# Patient Record
Sex: Male | Born: 1945 | Race: White | Hispanic: No | Marital: Married | Smoking: Never smoker
Health system: Southern US, Community
[De-identification: ages and names within clinical notes are randomized; demographics above are authoritative.]

## PROBLEM LIST (undated history)

## (undated) DIAGNOSIS — I5030 Unspecified diastolic (congestive) heart failure: Secondary | ICD-10-CM

## (undated) DIAGNOSIS — I10 Essential (primary) hypertension: Secondary | ICD-10-CM

## (undated) DIAGNOSIS — I495 Sick sinus syndrome: Secondary | ICD-10-CM

## (undated) DIAGNOSIS — T1490XA Injury, unspecified, initial encounter: Secondary | ICD-10-CM

## (undated) DIAGNOSIS — D649 Anemia, unspecified: Secondary | ICD-10-CM

## (undated) DIAGNOSIS — J45909 Unspecified asthma, uncomplicated: Secondary | ICD-10-CM

## (undated) DIAGNOSIS — E119 Type 2 diabetes mellitus without complications: Secondary | ICD-10-CM

## (undated) DIAGNOSIS — G4733 Obstructive sleep apnea (adult) (pediatric): Secondary | ICD-10-CM

## (undated) DIAGNOSIS — I2510 Atherosclerotic heart disease of native coronary artery without angina pectoris: Secondary | ICD-10-CM

---

## 2006-11-19 ENCOUNTER — Emergency Department (HOSPITAL_COMMUNITY): Admission: EM | Admit: 2006-11-19 | Discharge: 2006-11-19 | Payer: Self-pay | Admitting: Emergency Medicine

## 2007-12-16 IMAGING — CR DG HIP COMPLETE 2+V*R*
3 series · 3 of 3 positions shown · non-contrast
Comparison: none

CLINICAL DATA: Fall, hip pain

RIGHT HIP - 2  VIEW:

[t pelvis a.p.]
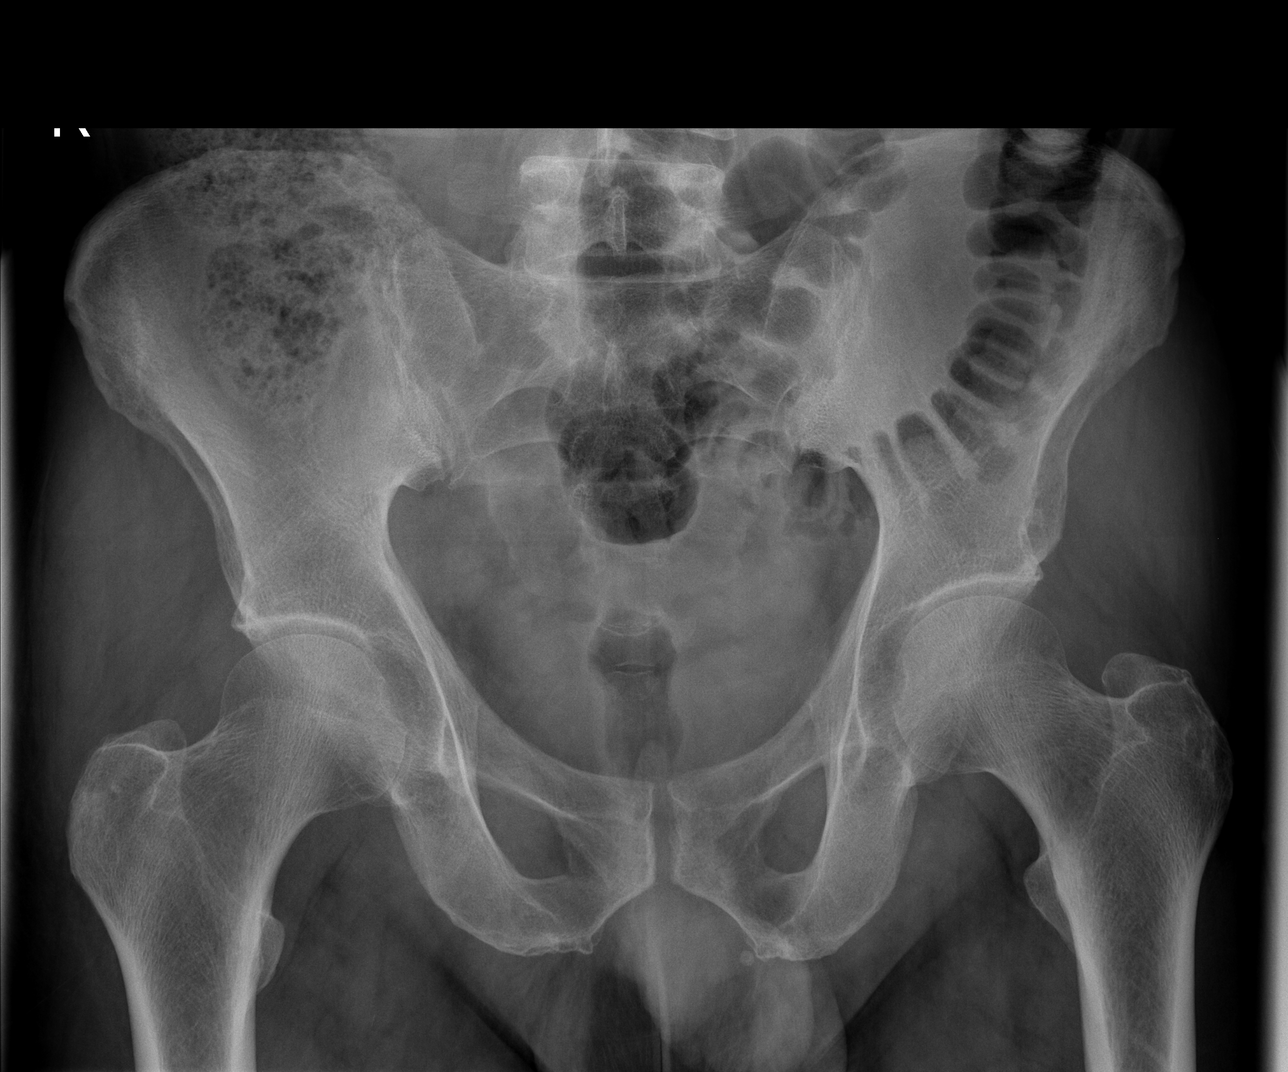

[t hip ap right]
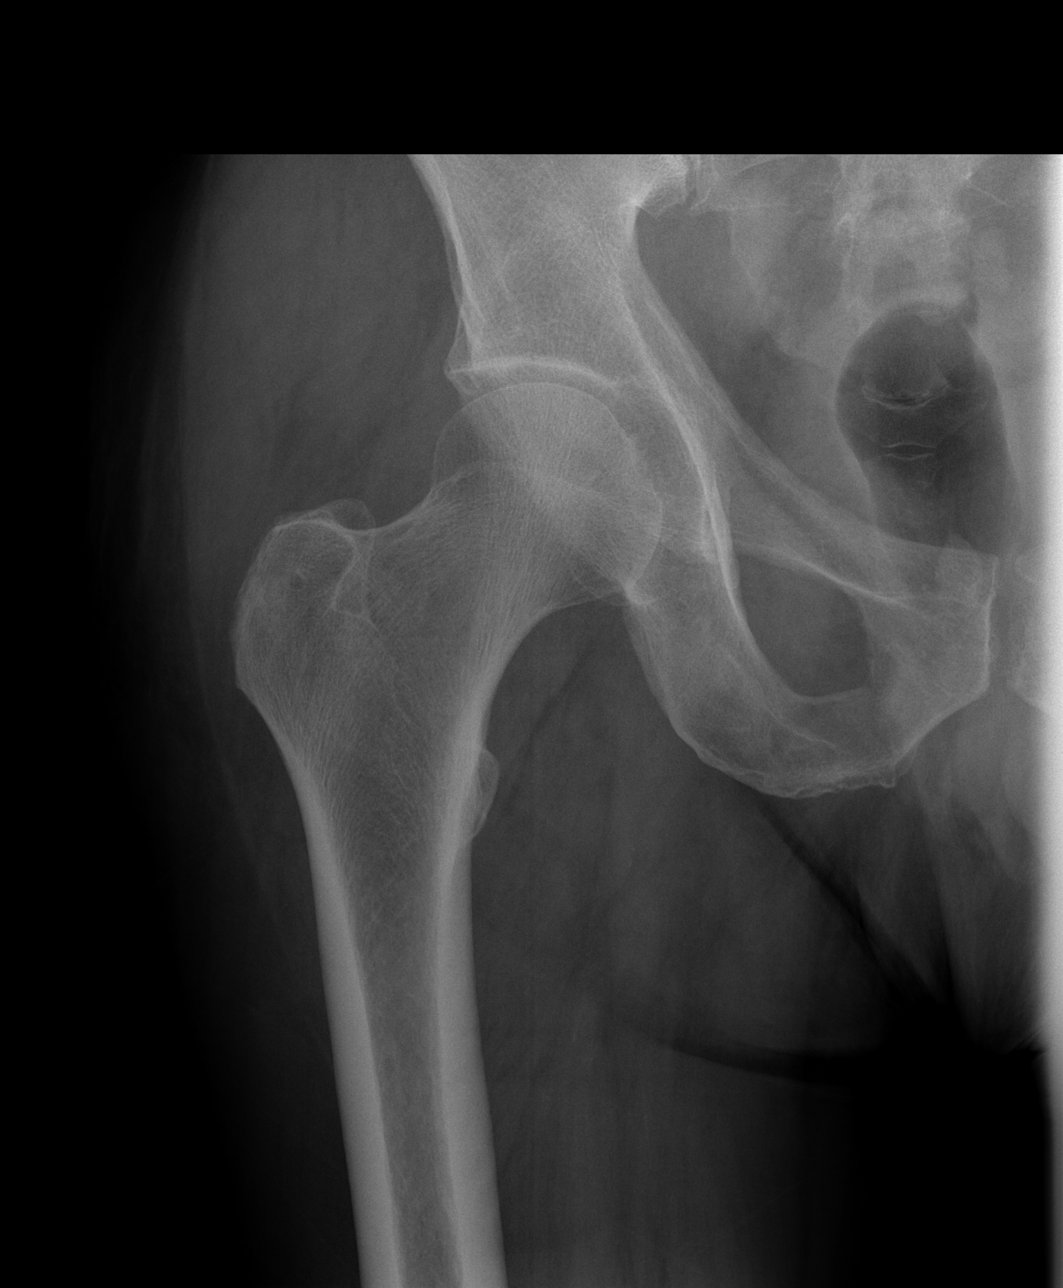

[t hip frog leg right]
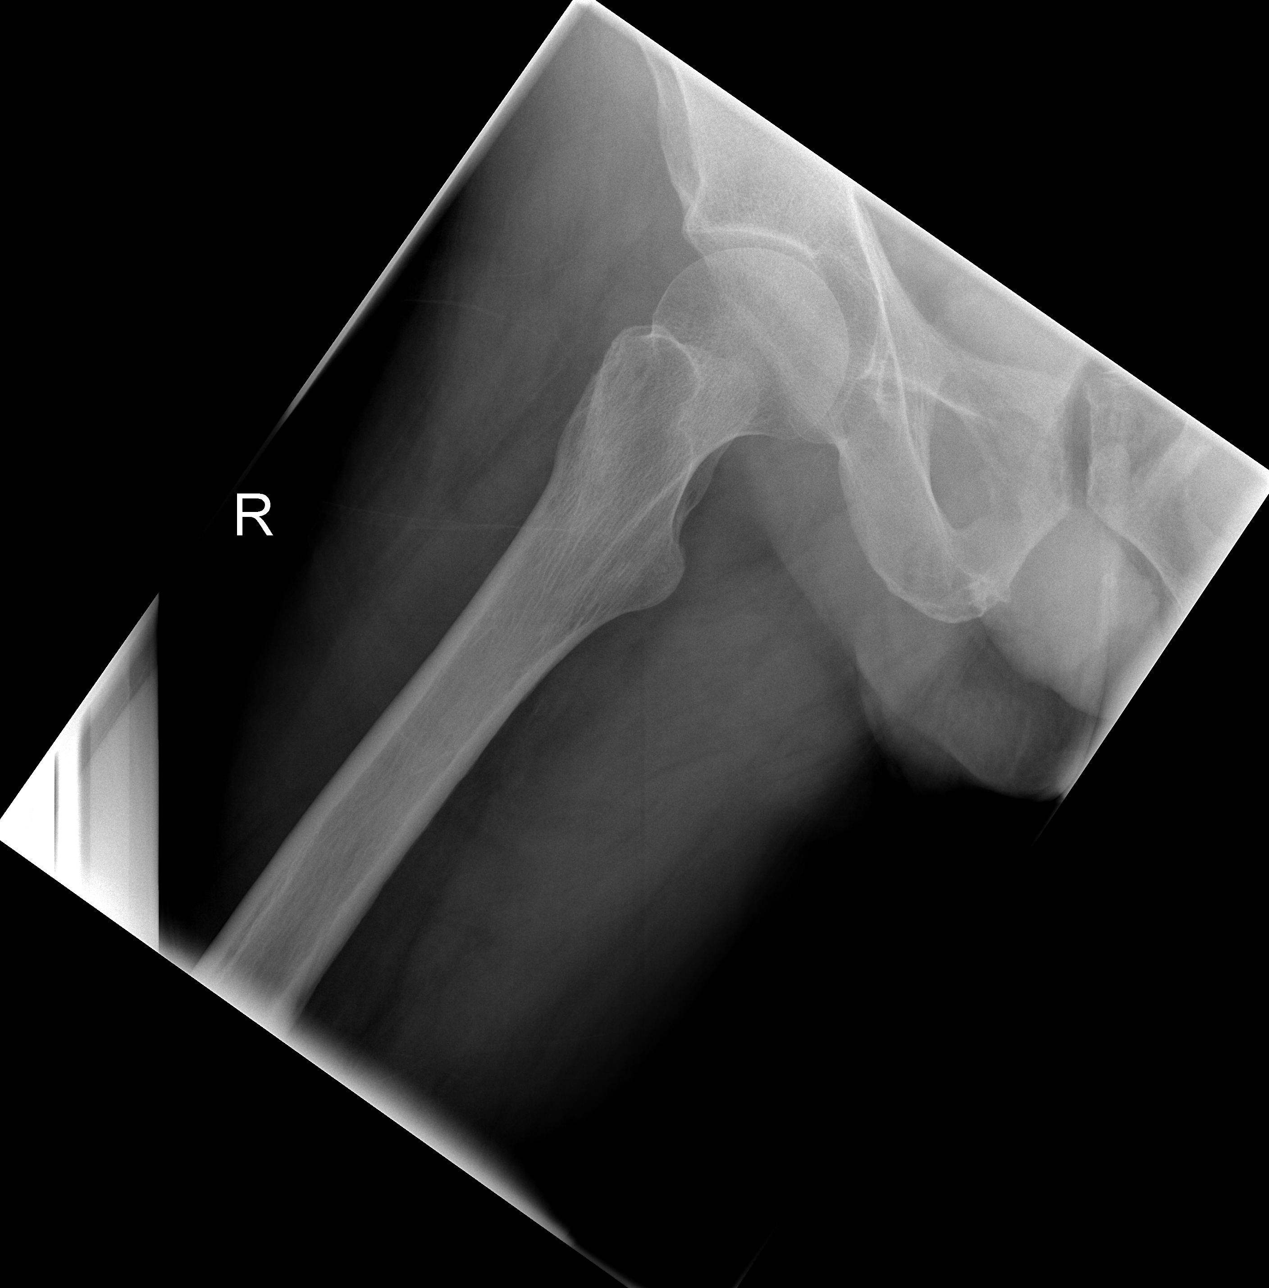

[3 of 3 positions shown; findings below may reference images not displayed]

FINDINGS: There is no evidence of hip fracture or dislocation.  There is no
evidence of arthropathy or other focal bone abnormality.
IMPRESSION: Negative.

## 2017-04-25 DIAGNOSIS — I10 Essential (primary) hypertension: Secondary | ICD-10-CM | POA: Diagnosis present

## 2017-04-25 DIAGNOSIS — E119 Type 2 diabetes mellitus without complications: Secondary | ICD-10-CM | POA: Insufficient documentation

## 2017-05-28 DIAGNOSIS — D500 Iron deficiency anemia secondary to blood loss (chronic): Secondary | ICD-10-CM | POA: Diagnosis present

## 2021-09-19 DIAGNOSIS — K222 Esophageal obstruction: Secondary | ICD-10-CM | POA: Insufficient documentation

## 2021-09-19 DIAGNOSIS — F0390 Unspecified dementia without behavioral disturbance: Secondary | ICD-10-CM | POA: Diagnosis present

## 2021-09-19 DIAGNOSIS — R001 Bradycardia, unspecified: Secondary | ICD-10-CM | POA: Insufficient documentation

## 2021-09-19 DIAGNOSIS — I495 Sick sinus syndrome: Secondary | ICD-10-CM | POA: Insufficient documentation

## 2021-09-19 DIAGNOSIS — G4733 Obstructive sleep apnea (adult) (pediatric): Secondary | ICD-10-CM | POA: Diagnosis present

## 2022-08-01 ENCOUNTER — Other Ambulatory Visit: Payer: Self-pay

## 2022-08-01 ENCOUNTER — Emergency Department (HOSPITAL_BASED_OUTPATIENT_CLINIC_OR_DEPARTMENT_OTHER): Payer: No Typology Code available for payment source

## 2022-08-01 ENCOUNTER — Inpatient Hospital Stay (HOSPITAL_BASED_OUTPATIENT_CLINIC_OR_DEPARTMENT_OTHER)
Admission: EM | Admit: 2022-08-01 | Discharge: 2022-08-03 | DRG: 177 | Disposition: A | Discharge status: Home Health | Payer: No Typology Code available for payment source | Attending: Internal Medicine | Admitting: Internal Medicine

## 2022-08-01 ENCOUNTER — Encounter (HOSPITAL_COMMUNITY): Payer: Self-pay | Admitting: Family Medicine

## 2022-08-01 DIAGNOSIS — R339 Retention of urine, unspecified: Secondary | ICD-10-CM | POA: Diagnosis present

## 2022-08-01 DIAGNOSIS — Z8673 Personal history of transient ischemic attack (TIA), and cerebral infarction without residual deficits: Secondary | ICD-10-CM

## 2022-08-01 DIAGNOSIS — K219 Gastro-esophageal reflux disease without esophagitis: Secondary | ICD-10-CM | POA: Diagnosis present

## 2022-08-01 DIAGNOSIS — Z79899 Other long term (current) drug therapy: Secondary | ICD-10-CM

## 2022-08-01 DIAGNOSIS — D500 Iron deficiency anemia secondary to blood loss (chronic): Secondary | ICD-10-CM | POA: Diagnosis not present

## 2022-08-01 DIAGNOSIS — I10 Essential (primary) hypertension: Secondary | ICD-10-CM | POA: Diagnosis present

## 2022-08-01 DIAGNOSIS — F0280 Dementia in other diseases classified elsewhere without behavioral disturbance: Secondary | ICD-10-CM | POA: Diagnosis present

## 2022-08-01 DIAGNOSIS — E1142 Type 2 diabetes mellitus with diabetic polyneuropathy: Secondary | ICD-10-CM | POA: Diagnosis present

## 2022-08-01 DIAGNOSIS — G309 Alzheimer's disease, unspecified: Secondary | ICD-10-CM | POA: Diagnosis present

## 2022-08-01 DIAGNOSIS — Z886 Allergy status to analgesic agent status: Secondary | ICD-10-CM

## 2022-08-01 DIAGNOSIS — J45909 Unspecified asthma, uncomplicated: Secondary | ICD-10-CM | POA: Diagnosis present

## 2022-08-01 DIAGNOSIS — G4733 Obstructive sleep apnea (adult) (pediatric): Secondary | ICD-10-CM | POA: Diagnosis present

## 2022-08-01 DIAGNOSIS — E1165 Type 2 diabetes mellitus with hyperglycemia: Secondary | ICD-10-CM | POA: Diagnosis present

## 2022-08-01 DIAGNOSIS — Z87820 Personal history of traumatic brain injury: Secondary | ICD-10-CM | POA: Diagnosis not present

## 2022-08-01 DIAGNOSIS — I5033 Acute on chronic diastolic (congestive) heart failure: Secondary | ICD-10-CM | POA: Diagnosis present

## 2022-08-01 DIAGNOSIS — I5030 Unspecified diastolic (congestive) heart failure: Secondary | ICD-10-CM | POA: Diagnosis present

## 2022-08-01 DIAGNOSIS — I2510 Atherosclerotic heart disease of native coronary artery without angina pectoris: Secondary | ICD-10-CM | POA: Diagnosis present

## 2022-08-01 DIAGNOSIS — R338 Other retention of urine: Secondary | ICD-10-CM | POA: Insufficient documentation

## 2022-08-01 DIAGNOSIS — I639 Cerebral infarction, unspecified: Secondary | ICD-10-CM | POA: Insufficient documentation

## 2022-08-01 DIAGNOSIS — R2689 Other abnormalities of gait and mobility: Secondary | ICD-10-CM | POA: Insufficient documentation

## 2022-08-01 DIAGNOSIS — E785 Hyperlipidemia, unspecified: Secondary | ICD-10-CM | POA: Diagnosis present

## 2022-08-01 DIAGNOSIS — J9601 Acute respiratory failure with hypoxia: Secondary | ICD-10-CM | POA: Diagnosis present

## 2022-08-01 DIAGNOSIS — Z950 Presence of cardiac pacemaker: Secondary | ICD-10-CM | POA: Insufficient documentation

## 2022-08-01 DIAGNOSIS — I110 Hypertensive heart disease with heart failure: Secondary | ICD-10-CM | POA: Diagnosis present

## 2022-08-01 DIAGNOSIS — I4821 Permanent atrial fibrillation: Secondary | ICD-10-CM | POA: Diagnosis not present

## 2022-08-01 DIAGNOSIS — Z7902 Long term (current) use of antithrombotics/antiplatelets: Secondary | ICD-10-CM | POA: Diagnosis not present

## 2022-08-01 DIAGNOSIS — F0390 Unspecified dementia without behavioral disturbance: Secondary | ICD-10-CM | POA: Diagnosis present

## 2022-08-01 DIAGNOSIS — Z888 Allergy status to other drugs, medicaments and biological substances status: Secondary | ICD-10-CM

## 2022-08-01 DIAGNOSIS — U071 COVID-19: Principal | ICD-10-CM | POA: Insufficient documentation

## 2022-08-01 DIAGNOSIS — I495 Sick sinus syndrome: Secondary | ICD-10-CM | POA: Diagnosis present

## 2022-08-01 DIAGNOSIS — K200 Eosinophilic esophagitis: Secondary | ICD-10-CM | POA: Insufficient documentation

## 2022-08-01 DIAGNOSIS — R509 Fever, unspecified: Secondary | ICD-10-CM | POA: Diagnosis present

## 2022-08-01 DIAGNOSIS — G252 Other specified forms of tremor: Secondary | ICD-10-CM | POA: Insufficient documentation

## 2022-08-01 DIAGNOSIS — Z7984 Long term (current) use of oral hypoglycemic drugs: Secondary | ICD-10-CM

## 2022-08-01 DIAGNOSIS — Z794 Long term (current) use of insulin: Secondary | ICD-10-CM

## 2022-08-01 DIAGNOSIS — R1314 Dysphagia, pharyngoesophageal phase: Secondary | ICD-10-CM | POA: Insufficient documentation

## 2022-08-01 DIAGNOSIS — Z882 Allergy status to sulfonamides status: Secondary | ICD-10-CM

## 2022-08-01 DIAGNOSIS — Z7901 Long term (current) use of anticoagulants: Secondary | ICD-10-CM

## 2022-08-01 HISTORY — DX: Type 2 diabetes mellitus without complications: E11.9

## 2022-08-01 HISTORY — DX: Atherosclerotic heart disease of native coronary artery without angina pectoris: I25.10

## 2022-08-01 HISTORY — DX: Injury, unspecified, initial encounter: T14.90XA

## 2022-08-01 HISTORY — DX: Unspecified asthma, uncomplicated: J45.909

## 2022-08-01 HISTORY — DX: Essential (primary) hypertension: I10

## 2022-08-01 HISTORY — DX: Anemia, unspecified: D64.9

## 2022-08-01 HISTORY — DX: Unspecified diastolic (congestive) heart failure: I50.30

## 2022-08-01 HISTORY — DX: Obstructive sleep apnea (adult) (pediatric): G47.33

## 2022-08-01 HISTORY — DX: Sick sinus syndrome: I49.5

## 2022-08-01 LAB — URINALYSIS, ROUTINE W REFLEX MICROSCOPIC
Bilirubin Urine: NEGATIVE
Glucose, UA: 100 mg/dL — AB
Hgb urine dipstick: NEGATIVE
Ketones, ur: NEGATIVE mg/dL
Leukocytes,Ua: NEGATIVE
Nitrite: NEGATIVE
Protein, ur: 100 mg/dL — AB
Specific Gravity, Urine: 1.025 (ref 1.005–1.030)
pH: 5.5 (ref 5.0–8.0)

## 2022-08-01 LAB — COMPREHENSIVE METABOLIC PANEL WITH GFR
ALT: 11 U/L (ref 0–44)
AST: 19 U/L (ref 15–41)
Albumin: 2.8 g/dL — ABNORMAL LOW (ref 3.5–5.0)
Alkaline Phosphatase: 70 U/L (ref 38–126)
Anion gap: 7 (ref 5–15)
BUN: 14 mg/dL (ref 8–23)
CO2: 23 mmol/L (ref 22–32)
Calcium: 8.1 mg/dL — ABNORMAL LOW (ref 8.9–10.3)
Chloride: 104 mmol/L (ref 98–111)
Creatinine, Ser: 1.27 mg/dL — ABNORMAL HIGH (ref 0.61–1.24)
GFR, Estimated: 59 mL/min — ABNORMAL LOW
Glucose, Bld: 216 mg/dL — ABNORMAL HIGH (ref 70–99)
Potassium: 4.2 mmol/L (ref 3.5–5.1)
Sodium: 134 mmol/L — ABNORMAL LOW (ref 135–145)
Total Bilirubin: 1.1 mg/dL (ref 0.3–1.2)
Total Protein: 6.5 g/dL (ref 6.5–8.1)

## 2022-08-01 LAB — URINALYSIS, MICROSCOPIC (REFLEX)

## 2022-08-01 LAB — CBC WITH DIFFERENTIAL/PLATELET
Abs Immature Granulocytes: 0.05 10*3/uL (ref 0.00–0.07)
Basophils Absolute: 0 10*3/uL (ref 0.0–0.1)
Basophils Relative: 1 %
Eosinophils Absolute: 0.1 10*3/uL (ref 0.0–0.5)
Eosinophils Relative: 1 %
HCT: 26.6 % — ABNORMAL LOW (ref 39.0–52.0)
Hemoglobin: 8.7 g/dL — ABNORMAL LOW (ref 13.0–17.0)
Immature Granulocytes: 1 %
Lymphocytes Relative: 9 %
Lymphs Abs: 0.8 10*3/uL (ref 0.7–4.0)
MCH: 30.4 pg (ref 26.0–34.0)
MCHC: 32.7 g/dL (ref 30.0–36.0)
MCV: 93 fL (ref 80.0–100.0)
Monocytes Absolute: 0.5 10*3/uL (ref 0.1–1.0)
Monocytes Relative: 7 %
Neutro Abs: 6.6 10*3/uL (ref 1.7–7.7)
Neutrophils Relative %: 81 %
Platelets: 163 10*3/uL (ref 150–400)
RBC: 2.86 MIL/uL — ABNORMAL LOW (ref 4.22–5.81)
RDW: 16 % — ABNORMAL HIGH (ref 11.5–15.5)
WBC: 8.1 10*3/uL (ref 4.0–10.5)
nRBC: 0 % (ref 0.0–0.2)

## 2022-08-01 LAB — TROPONIN I (HIGH SENSITIVITY)
Troponin I (High Sensitivity): 30 ng/L — ABNORMAL HIGH (ref <18)
Troponin I (High Sensitivity): 62 ng/L — ABNORMAL HIGH

## 2022-08-01 LAB — GLUCOSE, CAPILLARY: Glucose-Capillary: 316 mg/dL — ABNORMAL HIGH (ref 70–99)

## 2022-08-01 LAB — RESP PANEL BY RT-PCR (RSV, FLU A&B, COVID)  RVPGX2
Influenza A by PCR: NEGATIVE
Influenza B by PCR: NEGATIVE
Resp Syncytial Virus by PCR: NEGATIVE
SARS Coronavirus 2 by RT PCR: POSITIVE — AB

## 2022-08-01 LAB — APTT: aPTT: 50 s — ABNORMAL HIGH (ref 24–36)

## 2022-08-01 LAB — PROTIME-INR
INR: 1.3 — ABNORMAL HIGH (ref 0.8–1.2)
Prothrombin Time: 16.5 s — ABNORMAL HIGH (ref 11.4–15.2)

## 2022-08-01 LAB — LACTIC ACID, PLASMA
Lactic Acid, Venous: 1.9 mmol/L (ref 0.5–1.9)
Lactic Acid, Venous: 1 mmol/L (ref 0.5–1.9)

## 2022-08-01 LAB — TSH: TSH: 1.687 u[IU]/mL (ref 0.350–4.500)

## 2022-08-01 LAB — BRAIN NATRIURETIC PEPTIDE: B Natriuretic Peptide: 556.5 pg/mL — ABNORMAL HIGH (ref 0.0–100.0)

## 2022-08-01 MED ORDER — SODIUM CHLORIDE 0.9 % IV SOLN: 250.0000 mL | INTRAVENOUS | Status: DC | PRN

## 2022-08-01 MED ORDER — SODIUM CHLORIDE 0.9 % IV SOLN
100.0000 mg | Freq: Every day | INTRAVENOUS | Status: AC
  Administered 2022-08-02 – 2022-08-03 (×2): 100 mg via INTRAVENOUS
  Filled 2022-08-01 (×2): qty 20

## 2022-08-01 MED ORDER — SODIUM CHLORIDE 0.9 % IV SOLN
500.0000 mg | INTRAVENOUS | Status: DC
  Administered 2022-08-01 – 2022-08-02 (×2): 500 mg via INTRAVENOUS
  Filled 2022-08-01 (×2): qty 5

## 2022-08-01 MED ORDER — INSULIN ASPART PROT & ASPART (70-30 MIX) 100 UNIT/ML ~~LOC~~ SUSP
30.0000 [IU] | Freq: Every day | SUBCUTANEOUS | Status: DC
  Administered 2022-08-02: 30 [IU] via SUBCUTANEOUS

## 2022-08-01 MED ORDER — SODIUM CHLORIDE 0.9 % IV SOLN
2.0000 g | INTRAVENOUS | Status: DC
  Administered 2022-08-01 – 2022-08-02 (×2): 2 g via INTRAVENOUS
  Filled 2022-08-01 (×2): qty 20

## 2022-08-01 MED ORDER — INSULIN ASPART PROT & ASPART (70-30 MIX) 100 UNIT/ML ~~LOC~~ SUSP
20.0000 [IU] | Freq: Every day | SUBCUTANEOUS | Status: DC
  Filled 2022-08-01: qty 10

## 2022-08-01 MED ORDER — ONDANSETRON HCL 4 MG/2ML IJ SOLN: 4.0000 mg | Freq: Four times a day (QID) | INTRAMUSCULAR | Status: DC | PRN

## 2022-08-01 MED ORDER — SODIUM CHLORIDE 0.9 % IV BOLUS (SEPSIS): 1000.0000 mL | Freq: Once | INTRAVENOUS | Status: DC

## 2022-08-01 MED ORDER — ALOGLIPTIN BENZOATE 25 MG PO TABS: 25.0000 mg | ORAL_TABLET | Freq: Every morning | ORAL | Status: DC

## 2022-08-01 MED ORDER — INSULIN ASPART 100 UNIT/ML IJ SOLN
0.0000 [IU] | Freq: Three times a day (TID) | INTRAMUSCULAR | Status: DC
  Administered 2022-08-02: 11 [IU] via SUBCUTANEOUS

## 2022-08-01 MED ORDER — METOPROLOL TARTRATE 5 MG/5ML IV SOLN: 5.0000 mg | Freq: Four times a day (QID) | INTRAVENOUS | Status: DC | PRN

## 2022-08-01 MED ORDER — METFORMIN HCL 500 MG PO TABS
1000.0000 mg | ORAL_TABLET | Freq: Two times a day (BID) | ORAL | Status: DC
  Administered 2022-08-02 – 2022-08-03 (×3): 1000 mg via ORAL
  Filled 2022-08-01 (×3): qty 2

## 2022-08-01 MED ORDER — INSULIN ASPART PROT & ASPART (70-30 MIX) 100 UNIT/ML PEN: 20.0000 [IU] | PEN_INJECTOR | Freq: Two times a day (BID) | SUBCUTANEOUS | Status: DC

## 2022-08-01 MED ORDER — ONDANSETRON HCL 4 MG PO TABS: 4.0000 mg | ORAL_TABLET | Freq: Four times a day (QID) | ORAL | Status: DC | PRN

## 2022-08-01 MED ORDER — LISINOPRIL 20 MG PO TABS
20.0000 mg | ORAL_TABLET | Freq: Every day | ORAL | Status: DC
  Administered 2022-08-02 – 2022-08-03 (×2): 20 mg via ORAL
  Filled 2022-08-01 (×2): qty 1

## 2022-08-01 MED ORDER — FUROSEMIDE 10 MG/ML IJ SOLN
40.0000 mg | Freq: Once | INTRAMUSCULAR | Status: AC
  Administered 2022-08-01: 40 mg via INTRAVENOUS
  Filled 2022-08-01: qty 4

## 2022-08-01 MED ORDER — SODIUM CHLORIDE 0.9% FLUSH: 3.0000 mL | INTRAVENOUS | Status: DC | PRN

## 2022-08-01 MED ORDER — TERAZOSIN HCL 5 MG PO CAPS
5.0000 mg | ORAL_CAPSULE | Freq: Every day | ORAL | Status: DC
  Administered 2022-08-02 – 2022-08-03 (×2): 5 mg via ORAL
  Filled 2022-08-01 (×3): qty 1

## 2022-08-01 MED ORDER — ROSUVASTATIN CALCIUM 20 MG PO TABS
20.0000 mg | ORAL_TABLET | Freq: Every day | ORAL | Status: DC
  Administered 2022-08-02 – 2022-08-03 (×2): 20 mg via ORAL
  Filled 2022-08-01 (×2): qty 1

## 2022-08-01 MED ORDER — HYDRALAZINE HCL 50 MG PO TABS
100.0000 mg | ORAL_TABLET | Freq: Three times a day (TID) | ORAL | Status: DC
  Administered 2022-08-02 – 2022-08-03 (×5): 100 mg via ORAL
  Filled 2022-08-01 (×5): qty 2

## 2022-08-01 MED ORDER — SODIUM CHLORIDE 0.9 % IV SOLN: 100.0000 mg | Freq: Every day | INTRAVENOUS | Status: DC

## 2022-08-01 MED ORDER — VITAMIN D (ERGOCALCIFEROL) 1.25 MG (50000 UNIT) PO CAPS: 50000.0000 [IU] | ORAL_CAPSULE | ORAL | Status: DC

## 2022-08-01 MED ORDER — POLYETHYLENE GLYCOL 3350 17 G PO PACK: 17.0000 g | PACK | Freq: Every day | ORAL | Status: DC | PRN

## 2022-08-01 MED ORDER — BUDESONIDE 0.5 MG/2ML IN SUSP
0.5000 mg | Freq: Two times a day (BID) | RESPIRATORY_TRACT | Status: DC
  Administered 2022-08-01 – 2022-08-03 (×4): 0.5 mg via RESPIRATORY_TRACT
  Filled 2022-08-01 (×4): qty 2

## 2022-08-01 MED ORDER — VITAMIN B-12 1000 MCG PO TABS
1000.0000 ug | ORAL_TABLET | Freq: Every day | ORAL | Status: DC
  Administered 2022-08-02 – 2022-08-03 (×2): 1000 ug via ORAL
  Filled 2022-08-01 (×2): qty 1

## 2022-08-01 MED ORDER — METHYLPREDNISOLONE SODIUM SUCC 125 MG IJ SOLR
1.0000 mg/kg | Freq: Two times a day (BID) | INTRAMUSCULAR | Status: DC
  Administered 2022-08-01 – 2022-08-03 (×5): 84.375 mg via INTRAVENOUS
  Filled 2022-08-01 (×5): qty 2

## 2022-08-01 MED ORDER — MEMANTINE HCL 10 MG PO TABS
10.0000 mg | ORAL_TABLET | Freq: Every day | ORAL | Status: DC
  Administered 2022-08-02 – 2022-08-03 (×2): 10 mg via ORAL
  Filled 2022-08-01 (×2): qty 1

## 2022-08-01 MED ORDER — METOPROLOL TARTRATE 25 MG PO TABS
25.0000 mg | ORAL_TABLET | Freq: Every morning | ORAL | Status: DC
  Administered 2022-08-03: 25 mg via ORAL
  Filled 2022-08-01 (×2): qty 1

## 2022-08-01 MED ORDER — GALANTAMINE HYDROBROMIDE ER 8 MG PO CP24
24.0000 mg | ORAL_CAPSULE | Freq: Every day | ORAL | Status: DC
  Administered 2022-08-02 – 2022-08-03 (×2): 24 mg via ORAL
  Filled 2022-08-01 (×3): qty 3

## 2022-08-01 MED ORDER — ACETAMINOPHEN 500 MG PO TABS
1000.0000 mg | ORAL_TABLET | ORAL | Status: DC | PRN
  Administered 2022-08-02: 1000 mg via ORAL
  Filled 2022-08-01 (×2): qty 2

## 2022-08-01 MED ORDER — LINAGLIPTIN 5 MG PO TABS
5.0000 mg | ORAL_TABLET | Freq: Every day | ORAL | Status: DC
  Administered 2022-08-02 – 2022-08-03 (×2): 5 mg via ORAL
  Filled 2022-08-01 (×2): qty 1

## 2022-08-01 MED ORDER — OXCARBAZEPINE 300 MG PO TABS
300.0000 mg | ORAL_TABLET | Freq: Every day | ORAL | Status: DC
  Administered 2022-08-01 – 2022-08-03 (×3): 300 mg via ORAL
  Filled 2022-08-01 (×3): qty 1

## 2022-08-01 MED ORDER — FUROSEMIDE 10 MG/ML IJ SOLN
40.0000 mg | Freq: Every day | INTRAMUSCULAR | Status: DC
  Administered 2022-08-02 – 2022-08-03 (×2): 40 mg via INTRAVENOUS
  Filled 2022-08-01 (×2): qty 4

## 2022-08-01 MED ORDER — SODIUM CHLORIDE 0.9 % IV SOLN
100.0000 mg | Freq: Once | INTRAVENOUS | Status: AC
  Administered 2022-08-01: 100 mg via INTRAVENOUS

## 2022-08-01 MED ORDER — FERROUS GLUCONATE 324 (38 FE) MG PO TABS
324.0000 mg | ORAL_TABLET | ORAL | Status: DC
  Administered 2022-08-03: 324 mg via ORAL
  Filled 2022-08-01: qty 1

## 2022-08-01 MED ORDER — PREDNISONE 50 MG PO TABS: 50.0000 mg | ORAL_TABLET | Freq: Every day | ORAL | Status: DC

## 2022-08-01 MED ORDER — ACETAMINOPHEN 325 MG PO TABS
650.0000 mg | ORAL_TABLET | Freq: Once | ORAL | Status: AC
  Administered 2022-08-01: 650 mg via ORAL
  Filled 2022-08-01: qty 2

## 2022-08-01 MED ORDER — PANTOPRAZOLE SODIUM 40 MG PO TBEC
40.0000 mg | DELAYED_RELEASE_TABLET | Freq: Every day | ORAL | Status: DC
  Administered 2022-08-02 – 2022-08-03 (×2): 40 mg via ORAL
  Filled 2022-08-01 (×2): qty 1

## 2022-08-01 MED ORDER — SODIUM CHLORIDE 0.9 % IV SOLN: 200.0000 mg | Freq: Once | INTRAVENOUS | Status: DC

## 2022-08-01 MED ORDER — INSULIN ASPART 100 UNIT/ML IJ SOLN
5.0000 [IU] | Freq: Once | INTRAMUSCULAR | Status: AC
  Administered 2022-08-01: 5 [IU] via SUBCUTANEOUS

## 2022-08-01 MED ORDER — SODIUM CHLORIDE 0.9% FLUSH
3.0000 mL | Freq: Two times a day (BID) | INTRAVENOUS | Status: DC
  Administered 2022-08-01 – 2022-08-03 (×4): 3 mL via INTRAVENOUS

## 2022-08-01 MED ORDER — TAMSULOSIN HCL 0.4 MG PO CAPS
0.4000 mg | ORAL_CAPSULE | Freq: Every day | ORAL | Status: DC
  Administered 2022-08-02 – 2022-08-03 (×2): 0.4 mg via ORAL
  Filled 2022-08-01 (×2): qty 1

## 2022-08-01 MED ORDER — GABAPENTIN 400 MG PO CAPS
800.0000 mg | ORAL_CAPSULE | Freq: Three times a day (TID) | ORAL | Status: DC
  Administered 2022-08-01 – 2022-08-03 (×5): 800 mg via ORAL
  Filled 2022-08-01 (×5): qty 2

## 2022-08-01 MED ORDER — LACTATED RINGERS IV SOLN: INTRAVENOUS | Status: DC

## 2022-08-01 MED ORDER — TRAMADOL HCL 50 MG PO TABS: 50.0000 mg | ORAL_TABLET | Freq: Three times a day (TID) | ORAL | Status: DC | PRN

## 2022-08-01 MED ORDER — DABIGATRAN ETEXILATE MESYLATE 150 MG PO CAPS
150.0000 mg | ORAL_CAPSULE | Freq: Two times a day (BID) | ORAL | Status: DC
  Administered 2022-08-01 – 2022-08-03 (×4): 150 mg via ORAL
  Filled 2022-08-01 (×6): qty 1

## 2022-08-01 NOTE — Assessment & Plan Note (Signed)
Continue Zestril Continue Lopressor Continue Hytrin

## 2022-08-01 NOTE — Assessment & Plan Note (Signed)
Continue PPI ?

## 2022-08-01 NOTE — Progress Notes (Signed)
Elink following code sepsis °

## 2022-08-01 NOTE — ED Triage Notes (Signed)
Reports + covid test today; been feeling unwell since yesterday with cough, congestion, fever and some trouble breathing.

## 2022-08-01 NOTE — ED Notes (Signed)
Called [facility] for Transport talked to [facility] at 4:12

## 2022-08-01 NOTE — Assessment & Plan Note (Signed)
Continue statin. 

## 2022-08-01 NOTE — Assessment & Plan Note (Signed)
Likely secondary to COVID O2 as needed Remdesivir Steroids Supportive care Could be a component of fluid overload as he has an elevated BNP and lower extremity swelling.  Will continue Lasix.

## 2022-08-01 NOTE — Assessment & Plan Note (Addendum)
Per problem list Echo - most recent 05/2022 with Normal EF per Care everywhere Trend troponins - stress test in 8/23 showed small area of reversible disease for medical management only EKG in the morning Continue telemetry Continue Lasix, beta-blockers

## 2022-08-01 NOTE — Hospital Course (Signed)
Patient is a 77 year old with past medical history of T2DM, HTN, HLD, HFpEF, sick sinus syndrome and chronic A-fib fib on anticoagulation with permanent pacemaker, history of TBI with dementia who is followed for most of his care at the [facility].  Recently had a TIA in January of this year.  He was in his usual state of health until last evening when he developed a hacking cough, nasal congestion, and fever.  This morning he got more short of breath and noted new onset lower extremity swelling.  He was noted to be COVID-positive at home and in the ED.  He had a negative chest x-ray but new oxygen requirement.  He was started on remdesivir and steroids.  He was also given a dose of IV antibiotics.  He had a normal lactic acid but elevated BNP.  He was given 1 dose of IV Lasix.

## 2022-08-01 NOTE — Assessment & Plan Note (Signed)
Per chart review, unable to reach wife to confirm if he is taking on CPAP or not

## 2022-08-01 NOTE — Assessment & Plan Note (Signed)
On beta-blockers Currently rate controlled On Pradaxa

## 2022-08-01 NOTE — Assessment & Plan Note (Addendum)
Likely secondary to COVID Oxygen as needed Remdesivir Steroids Supportive care Could be component of fluid overload as patient has swelling in his lower extremities as well as an elevated BNP. Continue Lasix On ceftriaxone and azithromycin in case there is some superimposed pneumonia-will check procalcitonin and limit antibiotics if needed.

## 2022-08-01 NOTE — H&P (Addendum)
History and Physical    Patient: Aaron Pope [id number] DOB: [birth year] DOA: 08/01/2022 DOS: the patient was seen and examined on 08/01/2022 PCP: [facility]  Patient coming from: Home  Chief Complaint:  Chief Complaint  Patient presents with   Fever   HPI: Aaron Pope is a 77 y.o. male with medical history significant of T2DM, HTN, HLD, HFpEF, sick sinus syndrome and chronic A-fib on anticoagulation with permanent pacemaker, history of TBI with dementia, OSA who is followed for most of his care at the [facility].  Recently had a TIA in January of this year.  He was in his usual state of health until last evening when he developed a hacking cough, nasal congestion, and fever.  This morning he got more short of breath and noted new onset lower extremity swelling.  He was noted to be COVID-positive at home and in the ED.  He had a negative chest x-ray but new oxygen requirement.  He was started on remdesivir and steroids.  He was also given a dose of IV antibiotics.  He had a normal lactic acid but elevated BNP.  He was given 1 dose of IV Lasix.  Review of Systems: As mentioned in the history of present illness. All other systems reviewed and are negative.  Past Medical History:  Diagnosis Date   Anemia    Asthma    Coronary artery disease    Diabetes mellitus without complication (HCC)    Diastolic heart failure (HCC)    Hypertension    OSA (obstructive sleep apnea)    Sick sinus syndrome ([place])    Trauma    History reviewed. No pertinent surgical history.  Social History:  has no history on file for tobacco use, alcohol use, and drug use.  Allergies  Allergen Reactions   Aspirin Anaphylaxis and Swelling    Mouth and throat swells   Shellfish Allergy Anaphylaxis   Sulfamethoxazole Itching   Betadine [Povidone Iodine] Other (See Comments)    Blisters    Semaglutide Nausea And Vomiting and Other (See Comments)    Loss of appetite, IG upset   Tape Itching  and Other (See Comments)    Paper tape    No family history on file.  Prior to Admission medications   Medication Sig Start Date End Date Taking? Authorizing Provider  acetaminophen (TYLENOL) 500 MG tablet Take 1,000 mg by mouth as needed for moderate pain. 04/25/17  Yes [provider]  Alogliptin Benzoate 25 MG TABS Take 25 mg by mouth every morning. 06/15/20  Yes [provider]  bismuth subsalicylate (RA STOMACH RELIEF) 262 MG/15ML suspension Take 15 mLs by mouth as needed for indigestion. 11/19/18  Yes [provider]  Carboxymethylcellulose Sod PF (REFRESH PLUS) 0.5 % SOLN Place 1 drop into both eyes daily as needed (dry eyes). 11/19/18  Yes [provider]  clotrimazole (LOTRIMIN) 1 % external solution Apply 1 Application topically daily as needed (breakout on toes). 06/23/20  Yes [provider]  cyanocobalamin (VITAMIN B12) 500 MCG tablet Take 1,000 mcg by mouth daily. 09/18/21  Yes [provider]  dabigatran (PRADAXA) 150 MG CAPS capsule Take 150 mg by mouth 2 (two) times daily. 04/25/17  Yes [provider]  ferrous gluconate (FERGON) 324 MG tablet Take 324 mg by mouth every Monday, Wednesday, and Friday. 01/12/20  Yes [provider]  furosemide (LASIX) 20 MG tablet Take 20 mg by mouth daily. 04/25/17  Yes [provider]  gabapentin (NEURONTIN) 400  MG capsule Take 800 mg by mouth 3 (three) times daily. 04/25/17  Yes [provider]  galantamine (RAZADYNE ER) 24 MG 24 hr capsule Take 24 mg by mouth daily. 06/07/21  Yes [provider]  hydrALAZINE (APRESOLINE) 100 MG tablet Take 100 mg by mouth 3 (three) times daily after meals. 04/25/17  Yes [provider]  insulin aspart protamine - aspart (NOVOLOG 70/30 MIX) (70-30) 100 UNIT/ML FlexPen Inject 20-30 Units into the skin 2 (two) times daily with a meal. 30 units with breakfast and 20 units with dinner. 05/14/22  Yes [provider]  lisinopril (ZESTRIL) 20 MG tablet Take 20 mg by mouth daily. 09/22/21  Yes [provider]  memantine (NAMENDA) 10 MG tablet Take 10 mg by mouth at bedtime. 06/07/21  Yes [provider]  metFORMIN (GLUCOPHAGE) 1000 MG tablet Take 1,000 mg by mouth 2 (two) times daily with a meal. 04/25/17  Yes [provider]  metoprolol tartrate (LOPRESSOR) 50 MG tablet Take 25 mg by mouth every morning. 05/14/22  Yes [provider]  mometasone (ASMANEX, 60 METERED DOSES,) 220 MCG/ACT inhaler Inhale 2 puffs into the lungs at bedtime. 08/31/21  Yes [provider]  Multiple Vitamin (MULTI-VITAMIN) tablet Take 1 tablet by mouth daily.   Yes [provider]  omeprazole (PRILOSEC) 40 MG capsule Take 40 mg by mouth 2 (two) times daily. 08/04/21  Yes [provider]  Oxcarbazepine (TRILEPTAL) 300 MG tablet Take 300 mg by mouth daily. 03/21/22  Yes [provider]  rosuvastatin (CRESTOR) 40 MG tablet Take 20 mg by mouth daily. 11/19/18  Yes [provider]  sildenafil (VIAGRA) 100 MG tablet Take 50 mg by mouth as needed for erectile dysfunction. (TAKE 1 HOUR PRIOR TO SEXUAL ACTIVITY *DO NOT EXCEED 1 DOSE PER 24 HOUR PERIOD*) 05/14/22  Yes [provider]  tamsulosin (FLOMAX) 0.4 MG CAPS capsule Take 0.4 mg by mouth daily. 06/28/22  Yes [provider]  terazosin (HYTRIN) 5 MG capsule Take 5 mg by mouth daily. 07/25/22  Yes [provider]  Vitamin D, Ergocalciferol, (DRISDOL) 1.25 MG (50000 UNIT) CAPS capsule Take 50,000 Units by mouth every 7 (seven) days. Mondays 05/14/22  Yes [provider]    Physical Exam: Vitals:   08/01/22 1530 08/01/22 1802 08/01/22 1902 08/01/22 1938  BP: (!) 149/69 (!) 147/73 (!) 181/84 (!) 157/75  Pulse: 60 66 62 60  Resp: '19 18 20 18  '$ Temp:  99.3 F (37.4 C) 98.1 F (36.7 C) 98.7 F (37.1 C)  TempSrc:  Oral Oral Oral  SpO2: 94% 95% 100% 98%  Weight:       Height:       Physical Examination: General appearance - alert, well appearing, and in no distress and oriented to person, place, and time Chest - clear to auscultation, no wheezes, rales or rhonchi, symmetric air entry Heart - irregularly irregular rhythm with rate controlled Abdomen - soft, nontender, nondistended, no masses or organomegaly Neurological - alert, oriented, normal speech, no focal findings Extremities - pedal edema 2-3 +, intact peripheral pulses Skin - normal coloration and turgor, no rashes, no suspicious skin lesions noted  Data Reviewed: DG Chest Portable 1 View  Result Date: 08/01/2022 CLINICAL DATA:  COVID positive test today. EXAM: PORTABLE CHEST 1 VIEW COMPARISON:  Chest radiographs 06/29/2022, 06/23/2022 FINDINGS: Left chest wall cardiac pacer is again seen with single lead overlying the right ventricle. Cardiac silhouette is again moderately enlarged. Mild-to-moderate calcification within the aortic  arch. No significant change in mild likely chronic interstitial thickening. No focal airspace opacity. No pleural effusion or pneumothorax. No acute skeletal abnormality. IMPRESSION: 1. No significant change in mild likely chronic interstitial thickening. No focal airspace opacity. 2. Stable cardiomegaly. Electronically Signed   By: Yvonne Kendall M.D.   On: 08/01/2022 12:50   Results for orders placed or performed during the hospital encounter of 08/01/22 (from the past 24 hour(s))  Resp panel by RT-PCR (RSV, Flu A&B, Covid) Anterior Nasal Swab     Status: Abnormal   Collection Time: 08/01/22 12:49 PM   Specimen: Anterior Nasal Swab  Result Value Ref Range   SARS Coronavirus 2 by RT PCR POSITIVE (A) NEGATIVE   Influenza A by PCR NEGATIVE NEGATIVE   Influenza B by PCR NEGATIVE NEGATIVE   Resp Syncytial Virus by PCR NEGATIVE NEGATIVE  Lactic acid, plasma     Status: None   Collection Time: 08/01/22 12:49 PM  Result Value Ref Range   Lactic Acid, Venous 1.9 0.5 - 1.9  mmol/L  Comprehensive metabolic panel     Status: Abnormal   Collection Time: 08/01/22 12:49 PM  Result Value Ref Range   Sodium 134 (L) 135 - 145 mmol/L   Potassium 4.2 3.5 - 5.1 mmol/L   Chloride 104 98 - 111 mmol/L   CO2 23 22 - 32 mmol/L   Glucose, Bld 216 (H) 70 - 99 mg/dL   BUN 14 8 - 23 mg/dL   Creatinine, Ser 1.27 (H) 0.61 - 1.24 mg/dL   Calcium 8.1 (L) 8.9 - 10.3 mg/dL   Total Protein 6.5 6.5 - 8.1 g/dL   Albumin 2.8 (L) 3.5 - 5.0 g/dL   AST 19 15 - 41 U/L   ALT 11 0 - 44 U/L   Alkaline Phosphatase 70 38 - 126 U/L   Total Bilirubin 1.1 0.3 - 1.2 mg/dL   GFR, Estimated 59 (L) >60 mL/min   Anion gap 7 5 - 15  CBC with Differential     Status: Abnormal   Collection Time: 08/01/22 12:49 PM  Result Value Ref Range   WBC 8.1 4.0 - 10.5 K/uL   RBC 2.86 (L) 4.22 - 5.81 MIL/uL   Hemoglobin 8.7 (L) 13.0 - 17.0 g/dL   HCT 26.6 (L) 39.0 - 52.0 %   MCV 93.0 80.0 - 100.0 fL   MCH 30.4 26.0 - 34.0 pg   MCHC 32.7 30.0 - 36.0 g/dL   RDW 16.0 (H) 11.5 - 15.5 %   Platelets 163 150 - 400 K/uL   nRBC 0.0 0.0 - 0.2 %   Neutrophils Relative % 81 %   Neutro Abs 6.6 1.7 - 7.7 K/uL   Lymphocytes Relative 9 %   Lymphs Abs 0.8 0.7 - 4.0 K/uL   Monocytes Relative 7 %   Monocytes Absolute 0.5 0.1 - 1.0 K/uL   Eosinophils Relative 1 %   Eosinophils Absolute 0.1 0.0 - 0.5 K/uL   Basophils Relative 1 %   Basophils Absolute 0.0 0.0 - 0.1 K/uL   Immature Granulocytes 1 %   Abs Immature Granulocytes 0.05 0.00 - 0.07 K/uL  Protime-INR     Status: Abnormal   Collection Time: 08/01/22 12:49 PM  Result Value Ref Range   Prothrombin Time 16.5 (H) 11.4 - 15.2 seconds   INR 1.3 (H) 0.8 - 1.2  APTT     Status: Abnormal   Collection Time: 08/01/22 12:49 PM  Result Value Ref Range   aPTT 50 (H)  24 - 36 seconds  Brain natriuretic peptide     Status: Abnormal   Collection Time: 08/01/22 12:49 PM  Result Value Ref Range   B Natriuretic Peptide 556.5 (H) 0.0 - 100.0 pg/mL  Troponin I (High  Sensitivity)     Status: Abnormal   Collection Time: 08/01/22 12:49 PM  Result Value Ref Range   Troponin I (High Sensitivity) 30 (H) <18 ng/L  Blood Culture (routine x 2)     Status: None (Preliminary result)   Collection Time: 08/01/22  1:00 PM   Specimen: BLOOD LEFT ARM  Result Value Ref Range   Specimen Description      BLOOD LEFT ARM Performed at [facility] [address] [place], [place] [address]    Special Requests      Blood Culture adequate volume BOTTLES DRAWN AEROBIC AND ANAEROBIC Performed at [facility], [address] [place], [place] [address]    Culture PENDING    Report Status PENDING   Blood Culture (routine x 2)     Status: None (Preliminary result)   Collection Time: 08/01/22  1:20 PM   Specimen: BLOOD RIGHT HAND  Result Value Ref Range   Specimen Description      BLOOD RIGHT HAND Performed at [facility], [place] [address], [place], [place] [address]    Special Requests      Blood Culture adequate volume BOTTLES DRAWN AEROBIC AND ANAEROBIC Performed at [facility], [address] [place], [place] [address]    Culture PENDING    Report Status PENDING   Urinalysis, Routine w reflex microscopic -Urine, Clean Catch     Status: Abnormal   Collection Time: 08/01/22  1:50 PM  Result Value Ref Range   Color, Urine YELLOW YELLOW   APPearance CLEAR CLEAR   Specific Gravity, Urine 1.025 1.005 - 1.030   pH 5.5 5.0 - 8.0   Glucose, UA 100 (A) NEGATIVE mg/dL   Hgb urine dipstick NEGATIVE NEGATIVE   Bilirubin Urine NEGATIVE NEGATIVE   Ketones, ur NEGATIVE NEGATIVE mg/dL   Protein, ur 100 (A) NEGATIVE mg/dL   Nitrite NEGATIVE NEGATIVE   Leukocytes,Ua NEGATIVE NEGATIVE  Urinalysis, Microscopic (reflex)     Status: Abnormal   Collection Time: 08/01/22  1:50 PM  Result Value Ref Range   RBC / HPF 0-5 0 - 5 RBC/hpf   WBC, UA 0-5 0 - 5 WBC/hpf   Bacteria, UA RARE (A) NONE SEEN   Squamous Epithelial / HPF 0-5 0 - 5 /HPF   Lactic acid, plasma     Status: None   Collection Time: 08/01/22  3:27 PM  Result Value Ref Range   Lactic Acid, Venous 1.0 0.5 - 1.9 mmol/L  Troponin I (High Sensitivity)     Status: Abnormal   Collection Time: 08/01/22  3:28 PM  Result Value Ref Range   Troponin I (High Sensitivity) 62 (H) <18 ng/L   EKG reveals atrial fibrillation, ST depression diffusely  Assessment and Plan: * Acute respiratory failure with hypoxia (HCC) Likely secondary to COVID Oxygen as needed Remdesivir Steroids Supportive care Could be component of fluid overload as patient has swelling in his lower extremities as well as an elevated BNP. Continue Lasix On ceftriaxone and azithromycin in case there is some superimposed pneumonia-will check procalcitonin and limit antibiotics if needed.  CVA (cerebral vascular accident) [facility]) Recent TIA  per patient with some urinary retention following this  Type 2 diabetes mellitus  with hyperglycemia (HCC) Carb modified diet Continue alogliptin Continue insulin 70/30 Continue metformin CBGs 4 times daily Sliding scale insulin Check A1c  OSA (obstructive sleep apnea) Per chart review, unable to reach wife to confirm if he is taking on CPAP or not  Essential hypertension Continue Zestril Continue Lopressor Continue Hytrin   Dementia (HCC) Continue galantamine Continue Namenda  Permanent atrial fibrillation (HCC) On beta-blockers Currently rate controlled On Pradaxa  Asthma Continue inhaled steroids  Peripheral sensory neuropathy due to type 2 diabetes mellitus ([place]) Continue gabapentin  Iron deficiency anemia due to chronic blood loss Continue iron Monday, Wednesday, Friday  Hyperlipidemia Continue statin  GERD (gastroesophageal reflux disease) Continue PPI  Diastolic congestive heart failure ([place]) Per problem list Echo - most recent 05/2022 with Normal EF per Care everywhere Trend troponins - stress test in 8/23 showed small area of  reversible disease for medical management only EKG in the morning Continue telemetry Continue Lasix, beta-blockers       Advance Care Planning:   Code Status: Full Code confirmed with the patient  Consults: None  Family Communication: Patient at bedside.  Attempted to call wife on 2 different numbers with no answer.  Severity of Illness: The appropriate patient status for this patient is INPATIENT. Inpatient status is judged to be reasonable and necessary in order to provide the required intensity of service to ensure the patient's safety. The patient's presenting symptoms, physical exam findings, and initial radiographic and laboratory data in the context of their chronic comorbidities is felt to place them at high risk for further clinical deterioration. Furthermore, it is not anticipated that the patient will be medically stable for discharge from the hospital within 2 midnights of admission.   * I certify that at the point of admission it is my clinical judgment that the patient will require inpatient hospital care spanning beyond 2 midnights from the point of admission due to high intensity of service, high risk for further deterioration and high frequency of surveillance required.*  Author: Donnamae Jude, MD 08/01/2022 8:51 PM  For on call review [email or web address].

## 2022-08-01 NOTE — Progress Notes (Signed)
Plan of Care Note for accepted transfer   Patient: Aaron Pope MRN: [id number]   [place]: 08/01/2022  Facility requesting transfer: [facility].  Requesting Provider: Lennice Sites, DO. Reason for transfer: Acute respiratory failure with hypoxia. Facility course:  Per Dr. Ronnald Nian: " Chief Complaint  Patient presents with   Fever   Aaron Pope is a 77 y.o. male.   Patient here with fever, tested positive for COVID.  Short of breath today.  History of heart failureHistory of A-fib, sick sinus syndrome status post pacemaker, catheter, Alzheimer's disease, heart failure, diabetes.  Very short of breath, leg swelling, fever and chills today.  Denies any nausea vomiting diarrhea.  Home health nurse tested for COVID which she was positive for.  Had cough and production as well."  Urinalysis, Microscopic (reflex) [id number] (Abnormal)   Collected: 08/01/22 1350   Updated: 08/01/22 1407    RBC / HPF 0-5 RBC/hpf   WBC, UA 0-5 WBC/hpf   Bacteria, UA RARE Abnormal    Squamous Epithelial / HPF 0-5 /HPF  Urinalysis, Routine w reflex microscopic -Urine, Clean Catch [id number] (Abnormal)   Collected: 08/01/22 1350   Updated: 08/01/22 1407   Specimen Source: Urine, Clean Catch    Color, Urine YELLOW   APPearance CLEAR   Specific Gravity, Urine 1.025   pH 5.5   Glucose, UA 100 Abnormal  mg/dL   Hgb urine dipstick NEGATIVE   Bilirubin Urine NEGATIVE   Ketones, ur NEGATIVE mg/dL   Protein, ur 100 Abnormal  mg/dL   Nitrite NEGATIVE   Leukocytes,Ua NEGATIVE  Urine Culture (for pregnant, neutropenic or urologic patients or patients with an indwelling urinary catheter) [id number]   Collected: 08/01/22 1350   Updated: 08/01/22 1354   Specimen Source: Urine, Clean Catch   Resp panel by RT-PCR (RSV, Flu A&B, Covid) Anterior Nasal Swab [id number] (Abnormal)   Collected: 08/01/22 1249   Updated: 08/01/22 1352   Specimen Source: Anterior Nasal Swab    SARS Coronavirus 2 by RT PCR  POSITIVE Abnormal    Influenza A by PCR NEGATIVE   Influenza B by PCR NEGATIVE   Resp Syncytial Virus by PCR NEGATIVE  Brain natriuretic peptide [id number] (Abnormal)   Collected: 08/01/22 1249   Updated: 08/01/22 1352   Specimen Type: Blood    B Natriuretic Peptide 556.5 High  pg/mL  Troponin I (High Sensitivity) [id number] (Abnormal)   Collected: 08/01/22 1249   Updated: 08/01/22 1343   Specimen Source: Vein    Troponin I (High Sensitivity) 30 High  ng/L  Lactic acid, plasma [id number]   Collected: 08/01/22 1249   Updated: 08/01/22 1337   Specimen Type: Blood    Lactic Acid, Venous 1.9 mmol/L  Comprehensive metabolic panel [id number] (Abnormal)   Collected: 08/01/22 1249   Updated: 08/01/22 1335   Specimen Type: Blood    Sodium 134 Low  mmol/L   Potassium 4.2 mmol/L   Chloride 104 mmol/L   CO2 23 mmol/L   Glucose, Bld 216 High  mg/dL   BUN 14 mg/dL   Creatinine, Ser 1.27 High  mg/dL   Calcium 8.1 Low  mg/dL   Total Protein 6.5 g/dL   Albumin 2.8 Low  g/dL   AST 19 U/L   ALT 11 U/L   Alkaline Phosphatase 70 U/L   Total Bilirubin 1.1 mg/dL   GFR, Estimated 59 Low  mL/min   Anion gap 7  Protime-INR [id number] (Abnormal)   Collected: 08/01/22 1249   Updated: 08/01/22 1329   Specimen Type:  Blood    Prothrombin Time 16.5 High  seconds   INR 1.3 High   APTT [id number] (Abnormal)   Collected: 08/01/22 1249   Updated: 08/01/22 1329   Specimen Type: Blood    aPTT 50 High  seconds  Blood Culture (routine x 2) [id number]   Collected: 08/01/22 1320   Updated: 08/01/22 1326   Specimen Type: Blood   Specimen Source: Peripheral   Blood Culture (routine x 2) [id number]   Collected: 08/01/22 1300   Updated: 08/01/22 1325   Specimen Type: Blood   CBC with Differential [id number] (Abnormal)   Collected: 08/01/22 1249   Updated: 08/01/22 1314   Specimen Type: Blood    WBC 8.1 K/uL   RBC 2.86 Low  MIL/uL   Hemoglobin 8.7 Low  g/dL   HCT 26.6 Low  %   MCV 93.0  fL   MCH 30.4 pg   MCHC 32.7 g/dL   RDW 16.0 High  %   Platelets 163 K/uL   nRBC 0.0 %   Neutrophils Relative % 81 %   Neutro Abs 6.6 K/uL   Lymphocytes Relative 9 %   Lymphs Abs 0.8 K/uL   Monocytes Relative 7 %   Monocytes Absolute 0.5 K/uL   Eosinophils Relative 1 %   Eosinophils Absolute 0.1 K/uL   Basophils Relative 1 %   Basophils Absolute 0.0 K/uL   Immature Granulocytes 1 %   Abs Immature Granulocytes 0.05 K/uL   Plan of care: The patient is accepted for admission to Progressive unit, at [facility].  He is currently on 2 L of oxygen via nasal cannula, ceftriaxone, azithromycin and remdesivir.  Author: Reubin Milan, MD 08/01/2022  Check [email or web address] for on-call coverage.  Nursing staff, Please call [facility] number on Amion as soon as patient's arrival, so appropriate admitting provider can evaluate the pt.

## 2022-08-01 NOTE — Assessment & Plan Note (Signed)
Per problem list Check echo Lasix Beta-blockers Repeat troponins, suspect heart strain

## 2022-08-01 NOTE — Assessment & Plan Note (Signed)
Recent TIA  per patient with some urinary retention following this

## 2022-08-01 NOTE — Assessment & Plan Note (Signed)
Continue galantamine Continue Namenda

## 2022-08-01 NOTE — ED Provider Notes (Signed)
[facility] Provider Note   CSN: [id number] Arrival date & time: 08/01/22  1204     History  Chief Complaint  Patient presents with   Fever    Aaron Pope is a 77 y.o. male.  Patient here with fever, tested positive for COVID.  Short of breath today.  History of heart failureHistory of A-fib, sick sinus syndrome status post pacemaker, catheter, Alzheimer's disease, heart failure, diabetes.  Very short of breath, leg swelling, fever and chills today.  Denies any nausea vomiting diarrhea.  Home health nurse tested for COVID which she was positive for.  Had cough and production as well.  The history is provided by the patient.       Home Medications Prior to Admission medications   Medication Sig Start Date End Date Taking? Authorizing Provider  acetaminophen (TYLENOL) 500 MG tablet Take 1,000 mg by mouth as needed for moderate pain. 04/25/17  Yes [provider]  Carboxymethylcellulose Sod PF (REFRESH PLUS) 0.5 % SOLN Place 1 drop into both eyes daily as needed (dry eyes). 11/19/18  Yes [provider]  dabigatran (PRADAXA) 150 MG CAPS capsule Take 150 mg by mouth 2 (two) times daily. 04/25/17  Yes [provider]  Vitamin D, Ergocalciferol, (DRISDOL) 1.25 MG (50000 UNIT) CAPS capsule Take 50,000 Units by mouth every 7 (seven) days. Mondays 05/14/22  Yes [provider]      Allergies    Aspirin, Shellfish allergy, Sulfamethoxazole, Betadine [povidone iodine], Semaglutide, and Tape    Review of Systems   Review of Systems  Physical Exam Updated Vital Signs BP (!) 188/80   Pulse 85   Temp 99 F (37.2 C) (Oral)   Resp (!) 22   Ht '6\' 2"'$  (1.88 m)   Wt 84.4 kg   SpO2 91%   BMI 23.88 kg/m  Physical Exam Vitals and nursing note reviewed.  Constitutional:      General: He is not in acute distress.    Appearance: He is well-developed. He is not ill-appearing.  HENT:     Head: Normocephalic  and atraumatic.     Nose: Nose normal.     Mouth/Throat:     Mouth: Mucous membranes are moist.  Eyes:     Extraocular Movements: Extraocular movements intact.     Conjunctiva/sclera: Conjunctivae normal.     Pupils: Pupils are equal, round, and reactive to light.  Cardiovascular:     Rate and Rhythm: Normal rate. Rhythm irregular.     Pulses: Normal pulses.     Heart sounds: Normal heart sounds. No murmur heard. Pulmonary:     Effort: Pulmonary effort is normal. No respiratory distress.     Breath sounds: Normal breath sounds.  Abdominal:     Palpations: Abdomen is soft.     Tenderness: There is no abdominal tenderness.  Musculoskeletal:        General: No swelling.     Cervical back: Normal range of motion and neck supple.     Right lower leg: Edema present.     Left lower leg: Edema present.  Skin:    General: Skin is warm and dry.     Capillary Refill: Capillary refill takes less than 2 seconds.  Neurological:     General: No focal deficit present.     Mental Status: He is alert.  Psychiatric:        Mood and Affect: Mood normal.     ED Results / Procedures /  Treatments   Labs (all labs ordered are listed, but only abnormal results are displayed) Labs Reviewed  RESP PANEL BY RT-PCR (RSV, FLU A&B, COVID)  RVPGX2 - Abnormal; Notable for the following components:      Result Value   SARS Coronavirus 2 by RT PCR POSITIVE (*)    All other components within normal limits  COMPREHENSIVE METABOLIC PANEL - Abnormal; Notable for the following components:   Sodium 134 (*)    Glucose, Bld 216 (*)    Creatinine, Ser 1.27 (*)    Calcium 8.1 (*)    Albumin 2.8 (*)    GFR, Estimated 59 (*)    All other components within normal limits  CBC WITH DIFFERENTIAL/PLATELET - Abnormal; Notable for the following components:   RBC 2.86 (*)    Hemoglobin 8.7 (*)    HCT 26.6 (*)    RDW 16.0 (*)    All other components within normal limits  PROTIME-INR - Abnormal; Notable for the  following components:   Prothrombin Time 16.5 (*)    INR 1.3 (*)    All other components within normal limits  APTT - Abnormal; Notable for the following components:   aPTT 50 (*)    All other components within normal limits  URINALYSIS, ROUTINE W REFLEX MICROSCOPIC - Abnormal; Notable for the following components:   Glucose, UA 100 (*)    Protein, ur 100 (*)    All other components within normal limits  BRAIN NATRIURETIC PEPTIDE - Abnormal; Notable for the following components:   B Natriuretic Peptide 556.5 (*)    All other components within normal limits  URINALYSIS, MICROSCOPIC (REFLEX) - Abnormal; Notable for the following components:   Bacteria, UA RARE (*)    All other components within normal limits  TROPONIN I (HIGH SENSITIVITY) - Abnormal; Notable for the following components:   Troponin I (High Sensitivity) 30 (*)    All other components within normal limits  CULTURE, BLOOD (ROUTINE X 2)  CULTURE, BLOOD (ROUTINE X 2)  URINE CULTURE  LACTIC ACID, PLASMA  LACTIC ACID, PLASMA  TROPONIN I (HIGH SENSITIVITY)    EKG EKG Interpretation  Date/Time:  Wednesday August 01 2022 12:46:19 EST Ventricular Rate:  79 PR Interval:    QRS Duration: 93 QT Interval:  388 QTC Calculation: 445 R Axis:   82 Text Interpretation: Atrial fibrillation Borderline right axis deviation Borderline low voltage, extremity leads Borderline ST depression, diffuse leads Confirmed by Lennice Sites ([id number]) on 08/01/2022 12:48:07 PM  Radiology DG Chest Portable 1 View  Result Date: 08/01/2022 CLINICAL DATA:  COVID positive test today. EXAM: PORTABLE CHEST 1 VIEW COMPARISON:  Chest radiographs 06/29/2022, 06/23/2022 FINDINGS: Left chest wall cardiac pacer is again seen with single lead overlying the right ventricle. Cardiac silhouette is again moderately enlarged. Mild-to-moderate calcification within the aortic arch. No significant change in mild likely chronic interstitial thickening. No focal airspace  opacity. No pleural effusion or pneumothorax. No acute skeletal abnormality. IMPRESSION: 1. No significant change in mild likely chronic interstitial thickening. No focal airspace opacity. 2. Stable cardiomegaly. Electronically Signed   By: Yvonne Kendall M.D.   On: 08/01/2022 12:50    Procedures .Critical Care  Performed by: Lennice Sites, DO Authorized by: Lennice Sites, DO   Critical care provider statement:    Critical care time (minutes):  35   Critical care was necessary to treat or prevent imminent or life-threatening deterioration of the following conditions:  Sepsis and respiratory failure   Critical care was time  spent personally by me on the following activities:  Blood draw for specimens, development of treatment plan with patient or surrogate, discussions with primary provider, evaluation of patient's response to treatment, examination of patient, obtaining history from patient or surrogate, ordering and performing treatments and interventions, ordering and review of laboratory studies, ordering and review of radiographic studies, pulse oximetry, re-evaluation of patient's condition and review of old charts   I assumed direction of critical care for this patient from another provider in my specialty: no       Medications Ordered in ED Medications  lactated ringers infusion ( Intravenous New Bag/Given 08/01/22 1304)  cefTRIAXone (ROCEPHIN) 2 g in sodium chloride 0.9 % 100 mL IVPB (0 g Intravenous Stopped 08/01/22 1351)  azithromycin (ZITHROMAX) 500 mg in sodium chloride 0.9 % 250 mL IVPB (500 mg Intravenous New Bag/Given 08/01/22 1350)  methylPREDNISolone sodium succinate (SOLU-MEDROL) 125 mg/2 mL injection 84.375 mg (84.375 mg Intravenous Given 08/01/22 1313)    Followed by  predniSONE (DELTASONE) tablet 50 mg (has no administration in time range)  remdesivir 100 mg in sodium chloride 0.9 % 100 mL IVPB (0 mg Intravenous Stopped 08/01/22 1402)    Followed by  remdesivir 100 mg in sodium  chloride 0.9 % 100 mL IVPB (100 mg Intravenous New Bag/Given 08/01/22 1401)  remdesivir 100 mg in sodium chloride 0.9 % 100 mL IVPB (has no administration in time range)  furosemide (LASIX) injection 40 mg (has no administration in time range)  acetaminophen (TYLENOL) tablet 650 mg (650 mg Oral Given 08/01/22 1255)    ED Course/ Medical Decision Making/ A&P                             Medical Decision Making Amount and/or Complexity of Data Reviewed Labs: ordered. Radiology: ordered. ECG/medicine tests: ordered.  Risk OTC drugs. Prescription drug management. Decision regarding hospitalization.   Norman Herrlich is here with fever shortness of breath.  Patient hypoxic upon arrival with oxygen in the 80s.  Increased work of breathing.  Swelling on his legs.  History of heart failure on anticoagulation, A-fib, diabetes, dementia.  Supposedly tested positive for COVID today.  Symptoms started today.  He arrives febrile tachycardic.  Code sepsis initiated.  Given that this is likely COVID will start remdesivir and steroids.  Will give 1 dose antibiotics in case there is a secondary infection.  Will check lactic acid, blood cultures, CBC, chest x-ray, urine studies.  Per my review and interpretation of chest x-ray there is no obvious pneumonia.  My suspicion is that this is likely hypoxia from COVID/may be volume overload.  Per my review and interpretation of labs, COVID test is positive.  No obvious pneumonia on chest x-ray per my review and interpretation.  BNP and troponin mildly elevated.  Will give him a dose IV Lasix as well in case that is playing a role of volume overload.  There is no significant anemia or electrolyte abnormality otherwise.  No significant leukocytosis or lactic acidosis.  Will admit for further respiratory failure in the setting of COVID and may be volume overload.  This chart was dictated using voice recognition software.  Despite best efforts to proofread,  errors can  occur which can change the documentation meaning.         Final Clinical Impression(s) / ED Diagnoses Final diagnoses:  Acute respiratory failure with hypoxia ([place])  COVID-19    Rx / DC Orders ED Discharge Orders  None         Lennice Sites, DO 08/01/22 1422

## 2022-08-01 NOTE — ED Notes (Signed)
ED TO INPATIENT HANDOFF REPORT  ED Nurse Name and Phone #: Newman Pies [phone]  S Name/Age/Gender Aaron Pope 77 y.o. male Room/Bed: MH02/MH02  Code Status   Code Status: Not on file  Home/SNF/Other Home Patient oriented to: self, place, time, and situation Is this baseline? Yes   Triage Complete: Triage complete  Chief Complaint Acute respiratory failure with hypoxia ([place]) [J96.01]  Triage Note Reports + covid test today; been feeling unwell since yesterday with cough, congestion, fever and some trouble breathing.    Allergies Allergies  Allergen Reactions   Aspirin Anaphylaxis and Swelling    Mouth and throat swells   Shellfish Allergy Anaphylaxis   Sulfamethoxazole Itching   Betadine [Povidone Iodine] Other (See Comments)    Blisters    Semaglutide Nausea And Vomiting and Other (See Comments)    Loss of appetite, IG upset   Tape Itching and Other (See Comments)    Paper tape    Level of Care/Admitting Diagnosis ED Disposition     ED Disposition  Admit   Condition  --   Capac: [place] [100102]  Level of Care: Progressive [102]  Admit to Progressive based on following criteria: RESPIRATORY PROBLEMS hypoxemic/hypercapnic respiratory failure that is responsive to NIPPV (BiPAP) or High Flow Nasal Cannula (6-80 lpm). Frequent assessment/intervention, no > Q2 hrs < Q4 hrs, to maintain oxygenation and pulmonary hygiene.  May [facility] patient to [facility] or [facility] if equivalent level of care is available:: No  Interfacility transfer: Yes  Covid Evaluation: Confirmed COVID Positive  Diagnosis: Acute respiratory failure with hypoxia [facility]) [id number]  Admitting Physician: Reubin [place] [id number]  Attending Physician: Reubin [place] [id number]  Certification:: I certify this patient will need inpatient services for at least 2 midnights  Estimated Length of Stay: 3          B Medical/Surgery  History See chart  A IV Location/Drains/Wounds Patient Lines/Drains/Airways Status     Active Line/Drains/Airways     Name Placement date Placement time Site Days   Peripheral IV 08/01/22 20 G Left Antecubital 08/01/22  1258  Antecubital  less than 1   Peripheral IV 08/01/22 20 G Right Wrist 08/01/22  1402  Wrist  less than 1            Intake/Output Last 24 hours  Intake/Output Summary (Last 24 hours) at 08/01/2022 1529 Last data filed at 08/01/2022 1518 Gross per 24 hour  Intake 550.42 ml  Output --  Net 550.42 ml    Labs/Imaging Results for orders placed or performed during the hospital encounter of 08/01/22 (from the past 48 hour(s))  Resp panel by RT-PCR (RSV, Flu A&B, Covid) Anterior Nasal Swab     Status: Abnormal   Collection Time: 08/01/22 12:49 PM   Specimen: Anterior Nasal Swab  Result Value Ref Range   SARS Coronavirus 2 by RT PCR POSITIVE (A) NEGATIVE    Comment: (NOTE) SARS-CoV-2 target nucleic acids are DETECTED.  The SARS-CoV-2 RNA is generally detectable in upper respiratory specimens during the acute phase of infection. Positive results are indicative of the presence of the identified virus, but do not rule out bacterial infection or co-infection with other pathogens not detected by the test. Clinical correlation with patient history and other diagnostic information is necessary to determine patient infection status. The expected result is Negative.  Fact Sheet for Patients: EntrepreneurPulse.com.au  Fact Sheet for Healthcare Providers: IncredibleEmployment.be  This test is not yet approved or cleared  by the [place] and  has been authorized for detection and/or diagnosis of SARS-CoV-2 by FDA under an Emergency Use Authorization (EUA).  This EUA will remain in effect (meaning this test can be used) for the duration of  the COVID-19 declaration under Section 564(b)(1) of the A ct, 21 U.S.C. section  360bbb-3(b)(1), unless the authorization is terminated or revoked sooner.     Influenza A by PCR NEGATIVE NEGATIVE   Influenza B by PCR NEGATIVE NEGATIVE    Comment: (NOTE) The Xpert Xpress SARS-CoV-2/FLU/RSV plus assay is intended as an aid in the diagnosis of influenza from Nasopharyngeal swab specimens and should not be used as a sole basis for treatment. Nasal washings and aspirates are unacceptable for Xpert Xpress SARS-CoV-2/FLU/RSV testing.  Fact Sheet for Patients: EntrepreneurPulse.com.au  Fact Sheet for Healthcare Providers: IncredibleEmployment.be  This test is not yet approved or cleared by the [place] FDA and has been authorized for detection and/or diagnosis of SARS-CoV-2 by FDA under an Emergency Use Authorization (EUA). This EUA will remain in effect (meaning this test can be used) for the duration of the COVID-19 declaration under Section 564(b)(1) of the Act, 21 U.S.C. section 360bbb-3(b)(1), unless the authorization is terminated or revoked.     Resp Syncytial Virus by PCR NEGATIVE NEGATIVE    Comment: (NOTE) Fact Sheet for Patients: EntrepreneurPulse.com.au  Fact Sheet for Healthcare Providers: IncredibleEmployment.be  This test is not yet approved or cleared by the [place] FDA and has been authorized for detection and/or diagnosis of SARS-CoV-2 by FDA under an Emergency Use Authorization (EUA). This EUA will remain in effect (meaning this test can be used) for the duration of the COVID-19 declaration under Section 564(b)(1) of the Act, 21 U.S.C. section 360bbb-3(b)(1), unless the authorization is terminated or revoked.  Performed at [facility], [address] [place], [place] [address]   Lactic acid, plasma     Status: None   Collection Time: 08/01/22 12:49 PM  Result Value Ref Range   Lactic Acid, Venous 1.9 0.5 - 1.9 mmol/L    Comment: Performed at  [facility], [address] [place], [place] [address]  Comprehensive metabolic panel     Status: Abnormal   Collection Time: 08/01/22 12:49 PM  Result Value Ref Range   Sodium 134 (L) 135 - 145 mmol/L   Potassium 4.2 3.5 - 5.1 mmol/L   Chloride 104 98 - 111 mmol/L   CO2 23 22 - 32 mmol/L   Glucose, Bld 216 (H) 70 - 99 mg/dL    Comment: Glucose reference range applies only to samples taken after fasting for at least 8 hours.   BUN 14 8 - 23 mg/dL   Creatinine, Ser 1.27 (H) 0.61 - 1.24 mg/dL   Calcium 8.1 (L) 8.9 - 10.3 mg/dL   Total Protein 6.5 6.5 - 8.1 g/dL   Albumin 2.8 (L) 3.5 - 5.0 g/dL   AST 19 15 - 41 U/L   ALT 11 0 - 44 U/L   Alkaline Phosphatase 70 38 - 126 U/L   Total Bilirubin 1.1 0.3 - 1.2 mg/dL   GFR, Estimated 59 (L) >60 mL/min    Comment: (NOTE) Calculated using the CKD-EPI Creatinine Equation (2021)    Anion gap 7 5 - 15    Comment: Performed at [facility], [address] [place], [place] [address]  CBC with Differential     Status: Abnormal   Collection Time: 08/01/22 12:49  PM  Result Value Ref Range   WBC 8.1 4.0 - 10.5 K/uL   RBC 2.86 (L) 4.22 - 5.81 MIL/uL   Hemoglobin 8.7 (L) 13.0 - 17.0 g/dL   HCT 26.6 (L) 39.0 - 52.0 %   MCV 93.0 80.0 - 100.0 fL   MCH 30.4 26.0 - 34.0 pg   MCHC 32.7 30.0 - 36.0 g/dL   RDW 16.0 (H) 11.5 - 15.5 %   Platelets 163 150 - 400 K/uL   nRBC 0.0 0.0 - 0.2 %   Neutrophils Relative % 81 %   Neutro Abs 6.6 1.7 - 7.7 K/uL   Lymphocytes Relative 9 %   Lymphs Abs 0.8 0.7 - 4.0 K/uL   Monocytes Relative 7 %   Monocytes Absolute 0.5 0.1 - 1.0 K/uL   Eosinophils Relative 1 %   Eosinophils Absolute 0.1 0.0 - 0.5 K/uL   Basophils Relative 1 %   Basophils Absolute 0.0 0.0 - 0.1 K/uL   Immature Granulocytes 1 %   Abs Immature Granulocytes 0.05 0.00 - 0.07 K/uL    Comment: Performed at [facility], [address] [place], [place] [address]  Protime-INR     Status: Abnormal   Collection Time:  08/01/22 12:49 PM  Result Value Ref Range   Prothrombin Time 16.5 (H) 11.4 - 15.2 seconds   INR 1.3 (H) 0.8 - 1.2    Comment: (NOTE) INR goal varies based on device and disease states. Performed at [facility], [address] [place], [place] [address]   APTT     Status: Abnormal   Collection Time: 08/01/22 12:49 PM  Result Value Ref Range   aPTT 50 (H) 24 - 36 seconds    Comment:        IF BASELINE aPTT IS ELEVATED, SUGGEST PATIENT RISK ASSESSMENT BE USED TO DETERMINE APPROPRIATE ANTICOAGULANT THERAPY. Performed at [facility], [address] [place], [place] [address]   Brain natriuretic peptide     Status: Abnormal   Collection Time: 08/01/22 12:49 PM  Result Value Ref Range   B Natriuretic Peptide 556.5 (H) 0.0 - 100.0 pg/mL    Comment: Performed at [facility], [address] [place], [place] [address]  Troponin I (High Sensitivity)     Status: Abnormal   Collection Time: 08/01/22 12:49 PM  Result Value Ref Range   Troponin I (High Sensitivity) 30 (H) <18 ng/L    Comment: (NOTE) Elevated high sensitivity troponin I (hsTnI) values and significant  changes across serial measurements may suggest ACS but many other  chronic and acute conditions are known to elevate hsTnI results.  Refer to the "Links" section for chest pain algorithms and additional  guidance. Performed at [facility], [address] [place], [place] [address]   Urinalysis, Routine w reflex microscopic -Urine, Clean Catch     Status: Abnormal   Collection Time: 08/01/22  1:50 PM  Result Value Ref Range   Color, Urine YELLOW YELLOW   APPearance CLEAR CLEAR   Specific Gravity, Urine 1.025 1.005 - 1.030   pH 5.5 5.0 - 8.0   Glucose, UA 100 (A) NEGATIVE mg/dL   Hgb urine dipstick NEGATIVE NEGATIVE   Bilirubin Urine NEGATIVE NEGATIVE   Ketones, ur NEGATIVE NEGATIVE mg/dL   Protein, ur 100 (A) NEGATIVE mg/dL   Nitrite NEGATIVE NEGATIVE   Leukocytes,Ua  NEGATIVE NEGATIVE    Comment: Performed at [facility],  [address] [place], [place] [address]  Urinalysis, Microscopic (reflex)     Status: Abnormal   Collection Time: 08/01/22  1:50 PM  Result Value Ref Range   RBC / HPF 0-5 0 - 5 RBC/hpf   WBC, UA 0-5 0 - 5 WBC/hpf   Bacteria, UA RARE (A) NONE SEEN   Squamous Epithelial / HPF 0-5 0 - 5 /HPF    Comment: Performed at [facility], [address], [place], [place] [address]   DG Chest Portable 1 View  Result Date: 08/01/2022 CLINICAL DATA:  COVID positive test today. EXAM: PORTABLE CHEST 1 VIEW COMPARISON:  Chest radiographs 06/29/2022, 06/23/2022 FINDINGS: Left chest wall cardiac pacer is again seen with single lead overlying the right ventricle. Cardiac silhouette is again moderately enlarged. Mild-to-moderate calcification within the aortic arch. No significant change in mild likely chronic interstitial thickening. No focal airspace opacity. No pleural effusion or pneumothorax. No acute skeletal abnormality. IMPRESSION: 1. No significant change in mild likely chronic interstitial thickening. No focal airspace opacity. 2. Stable cardiomegaly. Electronically Signed   By: Yvonne Kendall M.D.   On: 08/01/2022 12:50    Pending Labs Unresulted Labs (From admission, onward)     Start     Ordered   08/01/22 1233  Lactic acid, plasma  (Septic presentation on arrival (screening labs, nursing and treatment orders for obvious sepsis))  STAT Now then every 2 hours,   R (with STAT occurrences)      08/01/22 1233   08/01/22 1233  Blood Culture (routine x 2)  (Septic presentation on arrival (screening labs, nursing and treatment orders for obvious sepsis))  BLOOD CULTURE X 2,   STAT      08/01/22 1233   08/01/22 1233  Urine Culture (for pregnant, neutropenic or urologic patients or patients with an indwelling urinary catheter)  (Septic presentation on arrival (screening labs, nursing and treatment orders for obvious sepsis))  Once,    URGENT       Question:  Indication  Answer:  Dysuria   08/01/22 1233            Vitals/Pain Today's Vitals   08/01/22 1351 08/01/22 1437 08/01/22 1500 08/01/22 1527  BP:   (!) 142/86   Pulse:   65   Resp:   19   Temp: 99 F (37.2 C)   99.6 F (37.6 C)  TempSrc: Oral   Oral  SpO2:   94%   Weight:      Height:      PainSc:  5       Isolation Precautions Airborne and Contact precautions  Medications Medications  lactated ringers infusion ( Intravenous New Bag/Given 08/01/22 1304)  cefTRIAXone (ROCEPHIN) 2 g in sodium chloride 0.9 % 100 mL IVPB (0 g Intravenous Stopped 08/01/22 1351)  azithromycin (ZITHROMAX) 500 mg in sodium chloride 0.9 % 250 mL IVPB (0 mg Intravenous Stopped 08/01/22 1518)  methylPREDNISolone sodium succinate (SOLU-MEDROL) 125 mg/2 mL injection 84.375 mg (84.375 mg Intravenous Given 08/01/22 1313)    Followed by  predniSONE (DELTASONE) tablet 50 mg (has no administration in time range)  remdesivir 100 mg in sodium chloride 0.9 % 100 mL IVPB (has no administration in time range)  acetaminophen (TYLENOL) tablet 650 mg (650 mg Oral Given 08/01/22 1255)  remdesivir 100 mg in sodium chloride 0.9 % 100 mL IVPB (0 mg Intravenous Stopped 08/01/22 1402)    Followed by  remdesivir 100 mg in sodium chloride 0.9 % 100 mL  IVPB (0 mg Intravenous Stopped 08/01/22 1432)  furosemide (LASIX) injection 40 mg (40 mg Intravenous Given 08/01/22 1433)    Mobility walks     Focused Assessments Pulmonary Assessment Handoff:  Lung sounds:   O2 Device: Nasal Cannula O2 Flow Rate (L/min): 2 L/min    R Recommendations: See Admitting Provider Note  Report given to:   Additional Notes: COVID+ with new 2L oxygen requirement. A/O x 4, ambulatory, continent b/b. 20ga IV in left AC and in right wrist. LR infusing at 150/hr in IV on right side.

## 2022-08-01 NOTE — Assessment & Plan Note (Addendum)
Continue iron Monday, Wednesday, Friday

## 2022-08-01 NOTE — Assessment & Plan Note (Signed)
Continue gabapentin.

## 2022-08-01 NOTE — ED Notes (Signed)
SpO2 85% with exertion, placed on 2LPM [place], EDP @ bedside

## 2022-08-01 NOTE — Assessment & Plan Note (Signed)
Continue inhaled steroids

## 2022-08-01 NOTE — ED Notes (Addendum)
Urinal at bedside, instructed to hit call bell if he needs assistance

## 2022-08-01 NOTE — Assessment & Plan Note (Addendum)
Carb modified diet Continue alogliptin Continue insulin 70/30 Continue metformin CBGs 4 times daily Sliding scale insulin Check A1c

## 2022-08-02 ENCOUNTER — Other Ambulatory Visit: Payer: Self-pay

## 2022-08-02 DIAGNOSIS — J9601 Acute respiratory failure with hypoxia: Secondary | ICD-10-CM | POA: Diagnosis not present

## 2022-08-02 LAB — BRAIN NATRIURETIC PEPTIDE: B Natriuretic Peptide: 988.2 pg/mL — ABNORMAL HIGH (ref 0.0–100.0)

## 2022-08-02 LAB — URINE CULTURE: Culture: <10000 — AB

## 2022-08-02 LAB — CBC
HCT: 24.9 % — ABNORMAL LOW (ref 39.0–52.0)
Hemoglobin: 7.9 g/dL — ABNORMAL LOW (ref 13.0–17.0)
MCH: 30.3 pg (ref 26.0–34.0)
MCHC: 31.7 g/dL (ref 30.0–36.0)
MCV: 95.4 fL (ref 80.0–100.0)
Platelets: 135 10*3/uL — ABNORMAL LOW (ref 150–400)
RBC: 2.61 MIL/uL — ABNORMAL LOW (ref 4.22–5.81)
RDW: 15.7 % — ABNORMAL HIGH (ref 11.5–15.5)
WBC: 5.1 10*3/uL (ref 4.0–10.5)
nRBC: 0 % (ref 0.0–0.2)

## 2022-08-02 LAB — BASIC METABOLIC PANEL
Anion gap: 8 (ref 5–15)
BUN: 18 mg/dL (ref 8–23)
CO2: 24 mmol/L (ref 22–32)
Calcium: 8.2 mg/dL — ABNORMAL LOW (ref 8.9–10.3)
Chloride: 103 mmol/L (ref 98–111)
Creatinine, Ser: 1.26 mg/dL — ABNORMAL HIGH (ref 0.61–1.24)
GFR, Estimated: 59 mL/min — ABNORMAL LOW (ref >60)
Glucose, Bld: 297 mg/dL — ABNORMAL HIGH (ref 70–99)
Potassium: 4.2 mmol/L (ref 3.5–5.1)
Sodium: 135 mmol/L (ref 135–145)

## 2022-08-02 LAB — GLUCOSE, CAPILLARY
Glucose-Capillary: 312 mg/dL — ABNORMAL HIGH (ref 70–99)
Glucose-Capillary: 246 mg/dL — ABNORMAL HIGH (ref 70–99)
Glucose-Capillary: 149 mg/dL — ABNORMAL HIGH (ref 70–99)
Glucose-Capillary: 177 mg/dL — ABNORMAL HIGH (ref 70–99)

## 2022-08-02 LAB — PROTIME-INR
INR: 1.8 — ABNORMAL HIGH (ref 0.8–1.2)
Prothrombin Time: 20.3 seconds — ABNORMAL HIGH (ref 11.4–15.2)

## 2022-08-02 LAB — APTT: aPTT: 80 seconds — ABNORMAL HIGH (ref 24–36)

## 2022-08-02 LAB — PROCALCITONIN: Procalcitonin: <0.1 ng/mL

## 2022-08-02 MED ORDER — INSULIN ASPART 100 UNIT/ML IJ SOLN
0.0000 [IU] | Freq: Three times a day (TID) | INTRAMUSCULAR | Status: DC
  Administered 2022-08-02: 8 [IU] via SUBCUTANEOUS
  Administered 2022-08-02: 2 [IU] via SUBCUTANEOUS
  Administered 2022-08-03: 8 [IU] via SUBCUTANEOUS
  Administered 2022-08-03: 3 [IU] via SUBCUTANEOUS

## 2022-08-02 MED ORDER — INSULIN ASPART 100 UNIT/ML IJ SOLN
6.0000 [IU] | Freq: Three times a day (TID) | INTRAMUSCULAR | Status: DC
  Administered 2022-08-02 – 2022-08-03 (×4): 6 [IU] via SUBCUTANEOUS

## 2022-08-02 MED ORDER — ORAL CARE MOUTH RINSE: 15.0000 mL | OROMUCOSAL | Status: DC | PRN

## 2022-08-02 MED ORDER — INSULIN GLARGINE-YFGN 100 UNIT/ML ~~LOC~~ SOLN
20.0000 [IU] | Freq: Two times a day (BID) | SUBCUTANEOUS | Status: DC
  Administered 2022-08-02 – 2022-08-03 (×2): 20 [IU] via SUBCUTANEOUS
  Filled 2022-08-02 (×3): qty 0.2

## 2022-08-02 NOTE — Evaluation (Signed)
Occupational Therapy Evaluation Patient Details Name: Aaron Pope MRN: [id number] DOB: 11/23/1945 Today's Date: 08/02/2022   History of Present Illness 77 year old with history of type 2 diabetes on insulin, hypertension, hyperlipidemia, chronic diastolic heart failure, sick sinus syndrome and chronic A-fib on anticoagulation, permanent pacemaker, sleep apnea presented to the emergency room with shortness of breath, lower extremity swelling for 1 day.  He also tested positive for COVID-19   Clinical Impression   Aaron Pope is a 77 year old man who presents with generalized weakness in the setting of COVID.  At baseline he has been receiving HH OT and PT at home, is independent with BADLs, has been ambulating with and without a cane. On evaluation he demonstrates good upper body strength and the ability to perform his baseline ADLs. He is able to ambulate to the bathroom holding onto the IV pole, perform toileting and toilet transfer independently. His gait is altered secondary to neuropathy in his feet but with IV pole he had no loss of balance. From an ADL standpoint he is near his baseline. Patient has no acute care OT needs. Would recommend mobility with nursing and MS.      Recommendations for follow up therapy are one component of a multi-disciplinary discharge planning process, led by the attending physician.  Recommendations may be updated based on patient status, additional functional criteria and insurance authorization.   Follow Up Recommendations  Home health OT     Assistance Recommended at Discharge Intermittent Supervision/Assistance  Patient can return home with the following Assistance with cooking/housework;Help with stairs or ramp for entrance    Functional Status Assessment  Patient has not had a recent decline in their functional status  Equipment Recommendations  None recommended by OT    Recommendations for Other Services       Precautions / Restrictions  Precautions Precautions: Fall Precaution Comments: neuropathy in feet Restrictions Weight Bearing Restrictions: No      Mobility Bed Mobility Overal bed mobility: Modified Independent                  Transfers   Equipment used: None               General transfer comment: Held onto the IV pole to ambulate to bathroom and back. Able to transfer onto low toilet      Balance Overall balance assessment: Mild deficits observed, not formally tested                                         ADL either performed or assessed with clinical judgement   ADL Overall ADL's : At baseline                                             Vision Patient Visual Report: No change from baseline       Perception     Praxis      Pertinent Vitals/Pain Pain Assessment Pain Assessment: No/denies pain     Hand Dominance Right   Extremity/Trunk Assessment Upper Extremity Assessment Upper Extremity Assessment: Overall WFL for tasks assessed   Lower Extremity Assessment Lower Extremity Assessment: Defer to PT evaluation   Cervical / Trunk Assessment Cervical / Trunk Assessment: Kyphotic   Communication Communication Communication: No difficulties  Cognition Arousal/Alertness: Awake/alert Behavior During Therapy: WFL for tasks assessed/performed Overall Cognitive Status: Within Functional Limits for tasks assessed                                       General Comments       Exercises     Shoulder Instructions      Home Living Family/patient expects to be discharged to:: Private residence Living Arrangements: Spouse/significant other Available Help at Discharge: Family;Available 24 hours/day Type of Home: House Home Access: Ramped entrance     Home Layout: Two level Alternate Level Stairs-Number of Steps: chair lift   Bathroom Shower/Tub: Occupational psychologist: Standard     Home Equipment:  Grab bars - toilet;Grab bars - tub/shower;Shower seat - built in   Additional Comments: elevated toilet seat, has lift chair      Prior Functioning/Environment Prior Level of Function : Independent/Modified Independent             Mobility Comments: has been getting home PT ADLs Comments: has bee getting home OT        OT Problem List: Impaired balance (sitting and/or standing);Decreased strength      OT Treatment/Interventions:      OT Goals(Current goals can be found in the care plan section) Acute Rehab OT Goals OT Goal Formulation: All assessment and education complete, DC therapy  OT Frequency:      Co-evaluation              AM-PAC OT "6 Clicks" Daily Activity     Outcome Measure Help from another person eating meals?: None Help from another person taking care of personal grooming?: None Help from another person toileting, which includes using toliet, bedpan, or urinal?: None Help from another person bathing (including washing, rinsing, drying)?: None Help from another person to put on and taking off regular upper body clothing?: None Help from another person to put on and taking off regular lower body clothing?: None 6 Click Score: 24   End of Session Nurse Communication: Mobility status  Activity Tolerance: Patient tolerated treatment well Patient left: in bed;with call bell/phone within reach;with family/visitor present  OT Visit Diagnosis: Muscle weakness (generalized) (M62.81)                Time: [id number] OT Time Calculation (min): 14 min Charges:  OT General Charges $OT Visit: 1 Visit OT Evaluation $OT Eval Low Complexity: 1 Low  Gustavo Lah, OTR/L [place]  Office [phone]   Lenward Chancellor 08/02/2022, 4:01 PM

## 2022-08-02 NOTE — Inpatient Diabetes Management (Signed)
Inpatient Diabetes Program Recommendations  AACE/ADA: New Consensus Statement on Inpatient Glycemic Control (2015)  Target Ranges:  Prepandial:   less than 140 mg/dL      Peak postprandial:   less than 180 mg/dL (1-2 hours)      Critically ill patients:  140 - 180 mg/dL   Lab Results  Component Value Date   GLUCAP 312 (H) 08/02/2022    Review of Glycemic Control  Latest Reference Range & Units 08/01/22 22:07 08/02/22 07:45  Glucose-Capillary 70 - 99 mg/dL 316 (H) 312 (H)  (H): Data is abnormally high Diabetes history: type 2 DM Outpatient Diabetes medications: Novolog 70/30-30 units QA/20 units QP, Metformin 1000 mg BID,  Current orders for Inpatient glycemic control: Novolog 70/30-30 units QA/20 units QP, Metformin 1000 mg BID, Tradjenta 5 mg QD, Novolog 0-15 units TID Solumedrol 84 mg BID A1C in process  Inpatient Diabetes Program Recommendations:    Consider: - switching to Semglee 20 units BID -Novolog 7 units TID (assuming patient consuming >50% of meals)   Thanks, Bronson Curb, MSN, RNC-OB Diabetes Coordinator [phone] (8a-5p)

## 2022-08-02 NOTE — Progress Notes (Signed)
PROGRESS NOTE    Aaron Pope  [id number] DOB: Mar 14, 1946 DOA: 08/01/2022 PCP: [facility]    Brief Narrative:  77 year old with history of type 2 diabetes on insulin, hypertension, hyperlipidemia, chronic diastolic heart failure, sick sinus syndrome and chronic A-fib on anticoagulation, permanent pacemaker, sleep apnea presented to the emergency room with shortness of breath, lower extremity swelling for 1 day.  He was also tested positive for COVID-19 at home and also in the emergency room.  Chest x-ray was normal.  He was needing 2 to 3 L of oxygen.  Admitted to the hospital due to significant symptoms.   Assessment & Plan:   COVID-19 infection with hypoxemia: Continue to monitor due to significant symptoms  chest physiotherapy, incentive spirometry, deep breathing exercises, sputum induction, mucolytic's and bronchodilators. Supplemental oxygen to keep saturations more than 90%. Covid directed therapy with , steroids on Solu-Medrol, will taper to prednisone on discharge remdesivir, day 2/3. antibiotics, on Rocephin azithromycin today due to significant symptoms. Mobilize.  PT OT.  Chronic diastolic heart failure, no evidence of exacerbation.  Discontinue IV fluids.  1 dose of IV Lasix today, will resume his home dose of Lasix and beta-blockers.  Chronic medical issues including History of a stroke, stable Type 2 diabetes with hyperglycemia, anticipate worsening with steroid use.  Continue long-acting insulin, add prandial insulin.  Also on alogliptin and metformin.  Continue.  Carb modified diet. Essential hypertension: Blood pressure stable on Zestril, Lopressor and Hytrin. Dementia, on galantamine and Namenda. Permanent A-fib and sick sinus syndrome status post pacemaker, on beta-blockers.  Currently sinus rhythm.  Therapeutic on Pradaxa.   DVT prophylaxis:  dabigatran (PRADAXA) capsule 150 mg   Code Status: Full code Family Communication: Tried to call  wife, mailbox is full and she does not pick up phone.  Patient is quite aware about the plan. Disposition Plan: Status is: Inpatient Remains inpatient appropriate because: Significant symptoms, IV antibiotics, respiratory therapy     Consultants:  None  Procedures:  None  Antimicrobials:  Rocephin and azithromycin 3/6---   Subjective: Patient seen in the morning rounds.  He was seated in the chair on room air, held by nurse technician and eating breakfast.  He tells me he feels much better.  Some dry cough.  Afebrile.  Objective: Vitals:   08/02/22 0500 08/02/22 0613 08/02/22 0816 08/02/22 0954  BP:  (!) 140/62  (!) 124/59  Pulse:  (!) 52  (!) 50  Resp:  (!) 21  18  Temp:  98.8 F (37.1 C)  98.2 F (36.8 C)  TempSrc:  Oral  Oral  SpO2:  97% 98% 100%  Weight: 92.7 kg     Height:        Intake/Output Summary (Last 24 hours) at 08/02/2022 1102 Last data filed at 08/02/2022 0900 Gross per 24 hour  Intake 1938.76 ml  Output 1600 ml  Net 338.76 ml   Filed Weights   08/01/22 1227 08/02/22 0500  Weight: 84.4 kg 92.7 kg    Examination:  General exam: Appears calm and comfortable  Respiratory system: No added sound.  On room air. Cardiovascular system: S1 & S2 heard, RRR.  1+ pedal edema both legs. Pacemaker left precordium. Gastrointestinal system: Abdomen is nondistended, soft and nontender. No organomegaly or masses felt. Normal bowel sounds heard. Central nervous system: Alert and oriented. No focal neurological deficits. Extremities: Symmetric 5 x 5 power. Skin: No rashes, lesions or ulcers Psychiatry: Judgement and insight appear normal. Mood & affect appropriate.  Data Reviewed: I have personally reviewed following labs and imaging studies  CBC: Recent Labs  Lab 08/01/22 1249 08/02/22 0435  WBC 8.1 5.1  NEUTROABS 6.6  --   HGB 8.7* 7.9*  HCT 26.6* 24.9*  MCV 93.0 95.4  PLT 163 [id number]*   Basic Metabolic Panel: Recent Labs  Lab 08/01/22 1249  08/02/22 0435  NA 134* 135  K 4.2 4.2  CL 104 103  CO2 23 24  GLUCOSE 216* 297*  BUN 14 18  CREATININE 1.27* 1.26*  CALCIUM 8.1* 8.2*   GFR: Estimated Creatinine Clearance: 58.9 mL/min (A) (by C-G formula based on SCr of 1.26 mg/dL (H)). Liver Function Tests: Recent Labs  Lab 08/01/22 1249  AST 19  ALT 11  ALKPHOS 70  BILITOT 1.1  PROT 6.5  ALBUMIN 2.8*   No results for input(s): "LIPASE", "AMYLASE" in the last 168 hours. No results for input(s): "AMMONIA" in the last 168 hours. Coagulation Profile: Recent Labs  Lab 08/01/22 1249 08/02/22 0435  INR 1.3* 1.8*   Cardiac Enzymes: No results for input(s): "CKTOTAL", "CKMB", "CKMBINDEX", "TROPONINI" in the last 168 hours. BNP (last 3 results) No results for input(s): "PROBNP" in the last 8760 hours. HbA1C: No results for input(s): "HGBA1C" in the last 72 hours. CBG: Recent Labs  Lab 08/01/22 2207 08/02/22 0745  GLUCAP 316* 312*   Lipid Profile: No results for input(s): "CHOL", "HDL", "LDLCALC", "TRIG", "CHOLHDL", "LDLDIRECT" in the last 72 hours. Thyroid Function Tests: Recent Labs    08/01/22 2045  TSH 1.687   Anemia Panel: No results for input(s): "VITAMINB12", "FOLATE", "FERRITIN", "TIBC", "IRON", "RETICCTPCT" in the last 72 hours. Sepsis Labs: Recent Labs  Lab 08/01/22 1249 08/01/22 1527 08/01/22 2045  PROCALCITON  --   --  <0.10  LATICACIDVEN 1.9 1.0  --     Recent Results (from the past 240 hour(s))  Resp panel by RT-PCR (RSV, Flu A&B, Covid) Anterior Nasal Swab     Status: Abnormal   Collection Time: 08/01/22 12:49 PM   Specimen: Anterior Nasal Swab  Result Value Ref Range Status   SARS Coronavirus 2 by RT PCR POSITIVE (A) NEGATIVE Final    Comment: (NOTE) SARS-CoV-2 target nucleic acids are DETECTED.  The SARS-CoV-2 RNA is generally detectable in upper respiratory specimens during the acute phase of infection. Positive results are indicative of the presence of the identified virus, but  do not rule out bacterial infection or co-infection with other pathogens not detected by the test. Clinical correlation with patient history and other diagnostic information is necessary to determine patient infection status. The expected result is Negative.  Fact Sheet for Patients: EntrepreneurPulse.com.au  Fact Sheet for Healthcare Providers: IncredibleEmployment.be  This test is not yet approved or cleared by the [place] FDA and  has been authorized for detection and/or diagnosis of SARS-CoV-2 by FDA under an Emergency Use Authorization (EUA).  This EUA will remain in effect (meaning this test can be used) for the duration of  the COVID-19 declaration under Section 564(b)(1) of the A ct, 21 U.S.C. section 360bbb-3(b)(1), unless the authorization is terminated or revoked sooner.     Influenza A by PCR NEGATIVE NEGATIVE Final   Influenza B by PCR NEGATIVE NEGATIVE Final    Comment: (NOTE) The Xpert Xpress SARS-CoV-2/FLU/RSV plus assay is intended as an aid in the diagnosis of influenza from Nasopharyngeal swab specimens and should not be used as a sole basis for treatment. Nasal washings and aspirates are unacceptable for Xpert Xpress SARS-CoV-2/FLU/RSV testing.  Fact Sheet for Patients: EntrepreneurPulse.com.au  Fact Sheet for Healthcare Providers: IncredibleEmployment.be  This test is not yet approved or cleared by the [place] FDA and has been authorized for detection and/or diagnosis of SARS-CoV-2 by FDA under an Emergency Use Authorization (EUA). This EUA will remain in effect (meaning this test can be used) for the duration of the COVID-19 declaration under Section 564(b)(1) of the Act, 21 U.S.C. section 360bbb-3(b)(1), unless the authorization is terminated or revoked.     Resp Syncytial Virus by PCR NEGATIVE NEGATIVE Final    Comment: (NOTE) Fact Sheet for  Patients: EntrepreneurPulse.com.au  Fact Sheet for Healthcare Providers: IncredibleEmployment.be  This test is not yet approved or cleared by the [place] FDA and has been authorized for detection and/or diagnosis of SARS-CoV-2 by FDA under an Emergency Use Authorization (EUA). This EUA will remain in effect (meaning this test can be used) for the duration of the COVID-19 declaration under Section 564(b)(1) of the Act, 21 U.S.C. section 360bbb-3(b)(1), unless the authorization is terminated or revoked.  Performed at [facility] [address] [place], [place] [address]   Blood Culture (routine x 2)     Status: None (Preliminary result)   Collection Time: 08/01/22  1:00 PM   Specimen: BLOOD LEFT ARM  Result Value Ref Range Status   Specimen Description   Final    BLOOD LEFT ARM Performed at [facility], [place] [address] [place], [place] [address]    Special Requests   Final    Blood Culture adequate volume BOTTLES DRAWN AEROBIC AND ANAEROBIC Performed at [facility], [address] [place], [place] [address]    Culture   Final    NO GROWTH < 24 HOURS Performed at [facility], [place] [address] [place], [place] [address]    Report Status PENDING  Incomplete  Blood Culture (routine x 2)     Status: None (Preliminary result)   Collection Time: 08/01/22  1:20 PM   Specimen: BLOOD RIGHT HAND  Result Value Ref Range Status   Specimen Description   Final    BLOOD RIGHT HAND Performed at [facility], [place] [address], [place], [place] [address]    Special Requests   Final    Blood Culture adequate volume BOTTLES DRAWN AEROBIC AND ANAEROBIC Performed at [facility], [place] [place], [place] [address]    Culture   Final    NO GROWTH < 24 HOURS Performed at [facility], [place] [address] [place],  [address]    Report Status PENDING  Incomplete          Radiology Studies: DG Chest Portable 1 View  Result Date: 08/01/2022 CLINICAL DATA:  COVID positive test today. EXAM: PORTABLE CHEST 1 VIEW COMPARISON:  Chest radiographs 06/29/2022, 06/23/2022 FINDINGS: Left chest wall cardiac pacer is again seen with single lead overlying the right ventricle. Cardiac silhouette is again moderately enlarged. Mild-to-moderate calcification within the aortic arch. No significant change in mild likely chronic interstitial thickening. No focal airspace opacity. No pleural effusion or pneumothorax. No acute skeletal abnormality. IMPRESSION: 1. No significant change in mild likely chronic interstitial thickening. No focal airspace opacity. 2. Stable cardiomegaly. Electronically Signed   By: Yvonne Kendall M.D.   On: 08/01/2022 12:50        Scheduled Meds:  budesonide  0.5 mg Nebulization BID   cyanocobalamin  1,000 mcg Oral Daily  dabigatran  150 mg Oral BID   [START ON 08/03/2022] ferrous gluconate  324 mg Oral Q M,W,F   furosemide  40 mg Intravenous Daily   gabapentin  800 mg Oral TID   galantamine  24 mg Oral Daily   hydrALAZINE  100 mg Oral TID PC   insulin aspart  0-15 Units Subcutaneous TID WC   insulin aspart  6 Units Subcutaneous TID WC   insulin glargine-yfgn  20 Units Subcutaneous BID   linagliptin  5 mg Oral Daily   lisinopril  20 mg Oral Daily   memantine  10 mg Oral Daily   metFORMIN  1,000 mg Oral BID WC   methylPREDNISolone (SOLU-MEDROL) injection  1 mg/kg Intravenous Q12H   Followed by   Derrill Memo ON 08/04/2022] predniSONE  50 mg Oral Daily   metoprolol tartrate  25 mg Oral q morning   Oxcarbazepine  300 mg Oral Daily   pantoprazole  40 mg Oral Daily   rosuvastatin  20 mg Oral Daily   sodium chloride flush  3 mL Intravenous Q12H   tamsulosin  0.4 mg Oral Daily   terazosin  5 mg Oral Daily   [START ON 08/06/2022] Vitamin D (Ergocalciferol)  50,000 Units Oral Q7 days   Continuous Infusions:  sodium chloride     azithromycin Stopped  (08/01/22 1518)   cefTRIAXone (ROCEPHIN)  IV Stopped (08/01/22 1351)   remdesivir 100 mg in sodium chloride 0.9 % 100 mL IVPB 100 mg (08/02/22 0950)     LOS: 1 day    Time spent: 35 minutes    Barb Merino, MD [facility] Pager [phone]

## 2022-08-03 DIAGNOSIS — J9601 Acute respiratory failure with hypoxia: Secondary | ICD-10-CM | POA: Diagnosis not present

## 2022-08-03 LAB — GLUCOSE, CAPILLARY
Glucose-Capillary: 191 mg/dL — ABNORMAL HIGH (ref 70–99)
Glucose-Capillary: 291 mg/dL — ABNORMAL HIGH (ref 70–99)

## 2022-08-03 LAB — HEMOGLOBIN A1C
Hgb A1c MFr Bld: 8.2 % — ABNORMAL HIGH (ref 4.8–5.6)
Mean Plasma Glucose: 189 mg/dL

## 2022-08-03 MED ORDER — DEXTROMETHORPHAN HBR 15 MG/5ML PO SYRP: 10.0000 mL | ORAL_SOLUTION | Freq: Four times a day (QID) | ORAL | 0 refills | Status: AC | PRN

## 2022-08-03 MED ORDER — GUAIFENESIN ER 600 MG PO TB12: 600.0000 mg | ORAL_TABLET | Freq: Two times a day (BID) | ORAL | 0 refills | Status: AC

## 2022-08-03 MED ORDER — PREDNISONE 20 MG PO TABS: 40.0000 mg | ORAL_TABLET | Freq: Every day | ORAL | 0 refills | Status: AC

## 2022-08-03 NOTE — Discharge Summary (Signed)
Physician Discharge Summary  Aaron Pope [id number] DOB: 10/31/1945 DOA: 08/01/2022  PCP: [facility]  Admit date: 08/01/2022 Discharge date: 08/03/2022  Admitted From: Home Disposition: Home with home health  Recommendations for Outpatient Follow-up:  Follow up with PCP in 1-2 weeks Please obtain BMP/CBC in one week   Home Health: PT/OT/RN Equipment/Devices: None  Discharge Condition: Stable CODE STATUS: Full code Diet recommendation: Low-salt diet, low-carb diet  Discharge summary: 77 year old with history of type 2 diabetes on insulin, hypertension, hyperlipidemia, chronic diastolic heart failure, sick sinus syndrome and chronic A-fib on anticoagulation, permanent pacemaker, sleep apnea presented to the emergency room with shortness of breath, lower extremity swelling for 1 day.  He was also tested positive for COVID-19 at home and also in the emergency room.  Chest x-ray was normal.  He was needing 2 to 3 L of oxygen.  Admitted to the hospital due to significant symptoms.  COVID-19 infection with hypoxemia: Symptomatically treated.  Treated with Solu-Medrol and tapered to prednisone.  Completed remdesivir day 3 today.  Symptoms improved.  Only has some dry cough. Continue prednisone for additional 5 days.  Over-the-counter cough medications and Mucinex. Continue to use breathing techniques and respite therapy at home. Mobilize in the hallway, not needing any supplemental oxygen.  Acute on chronic diastolic heart failure: Presented with lower extremity swelling edema, cough and shortness of breath.  Unsure from COVID 19 or from diastolic heart failure. Good response to IV Lasix 40 mg daily.  Symptoms mostly improved. Takes Lasix 20 mg daily at home.  He will take Lasix 40 mg daily for 1 week then go back on 20 mg daily. Resume beta-blockers.   Chronic medical issues including History of stroke, stable Type 2 diabetes with hyperglycemia, stable.  Quick taper off  steroids.  Continue long-acting insulin, alogliptin and metformin. Carb modified diet. Essential hypertension: Blood pressure stable on Zestril, Lopressor and Hytrin. Dementia, on galantamine and Namenda. Permanent A-fib and sick sinus syndrome status post pacemaker, on beta-blockers.  Currently sinus rhythm.  Therapeutic on Pradaxa.  Clinically stable for discharge.  Discharge Diagnoses:  Principal Problem:   Acute respiratory failure with hypoxia (HCC) Active Problems:   Diastolic congestive heart failure (HCC)   GERD (gastroesophageal reflux disease)   Hyperlipidemia   Iron deficiency anemia due to chronic blood loss   Peripheral sensory neuropathy due to type 2 diabetes mellitus (HCC)   Asthma   Permanent atrial fibrillation (HCC)   Dementia (HCC)   Essential hypertension   OSA (obstructive sleep apnea)   Type 2 diabetes mellitus with hyperglycemia (HCC)   CVA (cerebral vascular accident) ([place])   COVID-19 virus infection    Discharge Instructions  Discharge Instructions     Call MD for:  difficulty breathing, headache or visual disturbances   Complete by: As directed    Diet - low sodium heart healthy   Complete by: As directed    Diet Carb Modified   Complete by: As directed    Discharge instructions   Complete by: As directed    Take your Lasix 2 tablets daily for 7 days and go back to usual dose of 1 tablet daily   Increase activity slowly   Complete by: As directed       Allergies as of 08/03/2022       Reactions   Aspirin Anaphylaxis, Swelling   Mouth and throat swells   Shellfish Allergy Anaphylaxis   Sulfamethoxazole Itching   Betadine [povidone Iodine] Other (See Comments)  Blisters    Semaglutide Nausea And Vomiting, Other (See Comments)   Loss of appetite, IG upset   Tape Itching, Other (See Comments)   Paper tape        Medication List     TAKE these medications    acetaminophen 500 MG tablet Commonly known as: TYLENOL Take 1,000 mg  by mouth as needed for moderate pain.   Alogliptin Benzoate 25 MG Tabs Take 25 mg by mouth every morning.   Asmanex (60 Metered Doses) 220 MCG/ACT inhaler Generic drug: mometasone Inhale 2 puffs into the lungs at bedtime.   clotrimazole 1 % external solution Commonly known as: LOTRIMIN Apply 1 Application topically daily as needed (breakout on toes).   cyanocobalamin 500 MCG tablet Commonly known as: VITAMIN B12 Take 1,000 mcg by mouth daily.   dextromethorphan 15 MG/5ML syrup Take 10 mLs (30 mg total) by mouth 4 (four) times daily as needed for cough.   ferrous gluconate 324 MG tablet Commonly known as: FERGON Take 324 mg by mouth every Monday, Wednesday, and Friday.   furosemide 20 MG tablet Commonly known as: LASIX Take 20 mg by mouth daily.   gabapentin 400 MG capsule Commonly known as: NEURONTIN Take 800 mg by mouth 3 (three) times daily.   galantamine 24 MG 24 hr capsule Commonly known as: RAZADYNE ER Take 24 mg by mouth daily.   guaiFENesin 600 MG 12 hr tablet Commonly known as: Mucinex Take 1 tablet (600 mg total) by mouth 2 (two) times daily for 14 days.   hydrALAZINE 100 MG tablet Commonly known as: APRESOLINE Take 100 mg by mouth 3 (three) times daily after meals.   insulin aspart protamine - aspart (70-30) 100 UNIT/ML FlexPen Commonly known as: NOVOLOG 70/30 MIX Inject 20-30 Units into the skin 2 (two) times daily with a meal. 30 units with breakfast and 20 units with dinner.   lisinopril 20 MG tablet Commonly known as: ZESTRIL Take 20 mg by mouth daily.   memantine 10 MG tablet Commonly known as: NAMENDA Take 10 mg by mouth at bedtime.   metFORMIN 1000 MG tablet Commonly known as: GLUCOPHAGE Take 1,000 mg by mouth 2 (two) times daily with a meal.   metoprolol tartrate 50 MG tablet Commonly known as: LOPRESSOR Take 25 mg by mouth every morning.   Multi-Vitamin tablet Take 1 tablet by mouth daily.   omeprazole 40 MG capsule Commonly known  as: PRILOSEC Take 40 mg by mouth 2 (two) times daily.   Oxcarbazepine 300 MG tablet Commonly known as: TRILEPTAL Take 300 mg by mouth daily.   Pradaxa 150 MG Caps capsule Generic drug: dabigatran Take 150 mg by mouth 2 (two) times daily.   predniSONE 20 MG tablet Commonly known as: DELTASONE Take 2 tablets (40 mg total) by mouth daily at 6 (six) AM for 5 days.   RA Stomach Relief 262 MG/15ML suspension Generic drug: bismuth subsalicylate Take 15 mLs by mouth as needed for indigestion.   Refresh Plus 0.5 % Soln Generic drug: Carboxymethylcellulose Sod PF Place 1 drop into both eyes daily as needed (dry eyes).   rosuvastatin 40 MG tablet Commonly known as: CRESTOR Take 20 mg by mouth daily.   sildenafil 100 MG tablet Commonly known as: VIAGRA Take 50 mg by mouth as needed for erectile dysfunction. (TAKE 1 HOUR PRIOR TO SEXUAL ACTIVITY *DO NOT EXCEED 1 DOSE PER 24 HOUR PERIOD*)   tamsulosin 0.4 MG Caps capsule Commonly known as: FLOMAX Take 0.4 mg by mouth daily.  terazosin 5 MG capsule Commonly known as: HYTRIN Take 5 mg by mouth daily.   Vitamin D (Ergocalciferol) 1.25 MG (50000 UNIT) Caps capsule Commonly known as: DRISDOL Take 50,000 Units by mouth every 7 (seven) days. Mondays        Allergies  Allergen Reactions   Aspirin Anaphylaxis and Swelling    Mouth and throat swells   Shellfish Allergy Anaphylaxis   Sulfamethoxazole Itching   Betadine [Povidone Iodine] Other (See Comments)    Blisters    Semaglutide Nausea And Vomiting and Other (See Comments)    Loss of appetite, IG upset   Tape Itching and Other (See Comments)    Paper tape    Consultations: None   Procedures/Studies: DG Chest Portable 1 View  Result Date: 08/01/2022 CLINICAL DATA:  COVID positive test today. EXAM: PORTABLE CHEST 1 VIEW COMPARISON:  Chest radiographs 06/29/2022, 06/23/2022 FINDINGS: Left chest wall cardiac pacer is again seen with single lead overlying the right  ventricle. Cardiac silhouette is again moderately enlarged. Mild-to-moderate calcification within the aortic arch. No significant change in mild likely chronic interstitial thickening. No focal airspace opacity. No pleural effusion or pneumothorax. No acute skeletal abnormality. IMPRESSION: 1. No significant change in mild likely chronic interstitial thickening. No focal airspace opacity. 2. Stable cardiomegaly. Electronically Signed   By: Yvonne Kendall M.D.   On: 08/01/2022 12:50   (Echo, Carotid, EGD, Colonoscopy, ERCP)    Subjective: Patient seen and examined in the morning rounds.  He denies any complaints.  He does have some dry cough and occasional sputum production.  Afebrile.  Mostly on room air.  Denies any nausea or vomiting.  Leg swelling persists but better than before.  Denies any orthopnea or PND.   Discharge Exam: Vitals:   08/03/22 0800 08/03/22 0846  BP:  133/66  Pulse:    Resp: 16 18  Temp:    SpO2:  98%   Vitals:   08/03/22 0600 08/03/22 0700 08/03/22 0800 08/03/22 0846  BP:    133/66  Pulse:      Resp: '16 18 16 18  '$ Temp:      TempSrc:      SpO2:    98%  Weight:      Height:        General: Pt is alert, awake, not in acute distress He was sitting in chair and eating breakfast.  On room air. Cardiovascular: RRR, S1/S2 +, no rubs, no gallops, pacemaker in place. Respiratory: CTA bilaterally, no wheezing, no rhonchi Abdominal: Soft, NT, ND, bowel sounds + Extremities: 1+ edema right leg.  Trace edema left leg.    The results of significant diagnostics from this hospitalization (including imaging, microbiology, ancillary and laboratory) are listed below for reference.     Microbiology: Recent Results (from the past 240 hour(s))  Resp panel by RT-PCR (RSV, Flu A&B, Covid) Anterior Nasal Swab     Status: Abnormal   Collection Time: 08/01/22 12:49 PM   Specimen: Anterior Nasal Swab  Result Value Ref Range Status   SARS Coronavirus 2 by RT PCR POSITIVE (A)  NEGATIVE Final    Comment: (NOTE) SARS-CoV-2 target nucleic acids are DETECTED.  The SARS-CoV-2 RNA is generally detectable in upper respiratory specimens during the acute phase of infection. Positive results are indicative of the presence of the identified virus, but do not rule out bacterial infection or co-infection with other pathogens not detected by the test. Clinical correlation with patient history and other diagnostic information is necessary to determine  patient infection status. The expected result is Negative.  Fact Sheet for Patients: EntrepreneurPulse.com.au  Fact Sheet for Healthcare Providers: IncredibleEmployment.be  This test is not yet approved or cleared by the [place] FDA and  has been authorized for detection and/or diagnosis of SARS-CoV-2 by FDA under an Emergency Use Authorization (EUA).  This EUA will remain in effect (meaning this test can be used) for the duration of  the COVID-19 declaration under Section 564(b)(1) of the A ct, 21 U.S.C. section 360bbb-3(b)(1), unless the authorization is terminated or revoked sooner.     Influenza A by PCR NEGATIVE NEGATIVE Final   Influenza B by PCR NEGATIVE NEGATIVE Final    Comment: (NOTE) The Xpert Xpress SARS-CoV-2/FLU/RSV plus assay is intended as an aid in the diagnosis of influenza from Nasopharyngeal swab specimens and should not be used as a sole basis for treatment. Nasal washings and aspirates are unacceptable for Xpert Xpress SARS-CoV-2/FLU/RSV testing.  Fact Sheet for Patients: EntrepreneurPulse.com.au  Fact Sheet for Healthcare Providers: IncredibleEmployment.be  This test is not yet approved or cleared by the [place] FDA and has been authorized for detection and/or diagnosis of SARS-CoV-2 by FDA under an Emergency Use Authorization (EUA). This EUA will remain in effect (meaning this test can be used) for the  duration of the COVID-19 declaration under Section 564(b)(1) of the Act, 21 U.S.C. section 360bbb-3(b)(1), unless the authorization is terminated or revoked.     Resp Syncytial Virus by PCR NEGATIVE NEGATIVE Final    Comment: (NOTE) Fact Sheet for Patients: EntrepreneurPulse.com.au  Fact Sheet for Healthcare Providers: IncredibleEmployment.be  This test is not yet approved or cleared by the [place] FDA and has been authorized for detection and/or diagnosis of SARS-CoV-2 by FDA under an Emergency Use Authorization (EUA). This EUA will remain in effect (meaning this test can be used) for the duration of the COVID-19 declaration under Section 564(b)(1) of the Act, 21 U.S.C. section 360bbb-3(b)(1), unless the authorization is terminated or revoked.  Performed at [facility], [address] [place], [place] [address]   Blood Culture (routine x 2)     Status: None (Preliminary result)   Collection Time: 08/01/22  1:00 PM   Specimen: BLOOD LEFT ARM  Result Value Ref Range Status   Specimen Description   Final    BLOOD LEFT ARM Performed at [facility], [place] [address] [place], [place] [address]    Special Requests   Final    Blood Culture adequate volume BOTTLES DRAWN AEROBIC AND ANAEROBIC Performed at [facility], [address] [place], [place] [address]    Culture   Final    NO GROWTH 2 DAYS Performed at [facility], [place] [address] [place], [place] [address]    Report Status PENDING  Incomplete  Blood Culture (routine x 2)     Status: None (Preliminary result)   Collection Time: 08/01/22  1:20 PM   Specimen: BLOOD RIGHT HAND  Result Value Ref Range Status   Specimen Description   Final    BLOOD RIGHT HAND Performed at [facility], [place] [address], [place],  [address]    Special Requests   Final    Blood Culture adequate volume BOTTLES DRAWN AEROBIC AND ANAEROBIC Performed at  [facility], [address] [place], [place] [address]    Culture   Final    NO GROWTH 2 DAYS Performed at [facility],  [address], [place], [place] [address]    Report Status PENDING  Incomplete  Urine Culture (for pregnant, neutropenic or urologic patients or patients with an indwelling urinary catheter)     Status: Abnormal   Collection Time: 08/01/22  1:50 PM   Specimen: Urine, Clean Catch  Result Value Ref Range Status   Specimen Description   Final    URINE, CLEAN CATCH Performed at [facility], [address] [place], [place] [address]    Special Requests   Final    NONE Performed at [facility], [address] [place], [place] [address]    Culture (A)  Final    <10,000 COLONIES/mL INSIGNIFICANT GROWTH Performed at [facility], [place] [address], [place], [place] [address]    Report Status 08/02/2022 FINAL  Final     Labs: BNP (last 3 results) Recent Labs    08/01/22 1249 08/02/22 0435  BNP 556.5* [id number]   Basic Metabolic Panel: Recent Labs  Lab 08/01/22 1249 08/02/22 0435  NA 134* 135  K 4.2 4.2  CL 104 103  CO2 23 24  GLUCOSE 216* 297*  BUN 14 18  CREATININE 1.27* 1.26*  CALCIUM 8.1* 8.2*   Liver Function Tests: Recent Labs  Lab 08/01/22 1249  AST 19  ALT 11  ALKPHOS 70  BILITOT 1.1  PROT 6.5  ALBUMIN 2.8*   No results for input(s): "LIPASE", "AMYLASE" in the last 168 hours. No results for input(s): "AMMONIA" in the last 168 hours. CBC: Recent Labs  Lab 08/01/22 1249 08/02/22 0435  WBC 8.1 5.1  NEUTROABS 6.6  --   HGB 8.7* 7.9*  HCT 26.6* 24.9*  MCV 93.0 95.4  PLT 163 135*   Cardiac Enzymes: No results for input(s): "CKTOTAL", "CKMB", "CKMBINDEX", "TROPONINI" in the last 168 hours. BNP: Invalid input(s): "POCBNP" CBG: Recent Labs  Lab 08/02/22 0745 08/02/22 1117 08/02/22 1659 08/02/22 2113 08/03/22 0753  GLUCAP 312* 246* 149* 177* 191*   D-Dimer No results for  input(s): "DDIMER" in the last 72 hours. Hgb A1c Recent Labs    08/01/22 2045  HGBA1C 8.2*   Lipid Profile No results for input(s): "CHOL", "HDL", "LDLCALC", "TRIG", "CHOLHDL", "LDLDIRECT" in the last 72 hours. Thyroid function studies Recent Labs    08/01/22 2045  TSH 1.687   Anemia work up No results for input(s): "VITAMINB12", "FOLATE", "FERRITIN", "TIBC", "IRON", "RETICCTPCT" in the last 72 hours. Urinalysis    Component Value Date/Time   COLORURINE YELLOW 08/01/2022 1350   APPEARANCEUR CLEAR 08/01/2022 1350   LABSPEC 1.025 08/01/2022 1350   PHURINE 5.5 08/01/2022 1350   GLUCOSEU 100 (A) 08/01/2022 1350   HGBUR NEGATIVE 08/01/2022 1350   BILIRUBINUR NEGATIVE 08/01/2022 1350   KETONESUR NEGATIVE 08/01/2022 1350   PROTEINUR 100 (A) 08/01/2022 1350   NITRITE NEGATIVE 08/01/2022 1350   LEUKOCYTESUR NEGATIVE 08/01/2022 1350   Sepsis Labs Recent Labs  Lab 08/01/22 1249 08/02/22 0435  WBC 8.1 5.1   Microbiology Recent Results (from the past 240 hour(s))  Resp panel by RT-PCR (RSV, Flu A&B, Covid) Anterior Nasal Swab     Status: Abnormal   Collection Time: 08/01/22 12:49 PM   Specimen: Anterior Nasal Swab  Result Value Ref Range Status   SARS Coronavirus 2 by RT PCR POSITIVE (A) NEGATIVE Final    Comment: (NOTE) SARS-CoV-2 target nucleic acids are DETECTED.  The SARS-CoV-2 RNA is generally detectable in upper respiratory specimens during the acute phase of infection. Positive  results are indicative of the presence of the identified virus, but do not rule out bacterial infection or co-infection with other pathogens not detected by the test. Clinical correlation with patient history and other diagnostic information is necessary to determine patient infection status. The expected result is Negative.  Fact Sheet for Patients: EntrepreneurPulse.com.au  Fact Sheet for Healthcare Providers: IncredibleEmployment.be  This test is  not yet approved or cleared by the [place] FDA and  has been authorized for detection and/or diagnosis of SARS-CoV-2 by FDA under an Emergency Use Authorization (EUA).  This EUA will remain in effect (meaning this test can be used) for the duration of  the COVID-19 declaration under Section 564(b)(1) of the A ct, 21 U.S.C. section 360bbb-3(b)(1), unless the authorization is terminated or revoked sooner.     Influenza A by PCR NEGATIVE NEGATIVE Final   Influenza B by PCR NEGATIVE NEGATIVE Final    Comment: (NOTE) The Xpert Xpress SARS-CoV-2/FLU/RSV plus assay is intended as an aid in the diagnosis of influenza from Nasopharyngeal swab specimens and should not be used as a sole basis for treatment. Nasal washings and aspirates are unacceptable for Xpert Xpress SARS-CoV-2/FLU/RSV testing.  Fact Sheet for Patients: EntrepreneurPulse.com.au  Fact Sheet for Healthcare Providers: IncredibleEmployment.be  This test is not yet approved or cleared by the [place] FDA and has been authorized for detection and/or diagnosis of SARS-CoV-2 by FDA under an Emergency Use Authorization (EUA). This EUA will remain in effect (meaning this test can be used) for the duration of the COVID-19 declaration under Section 564(b)(1) of the Act, 21 U.S.C. section 360bbb-3(b)(1), unless the authorization is terminated or revoked.     Resp Syncytial Virus by PCR NEGATIVE NEGATIVE Final    Comment: (NOTE) Fact Sheet for Patients: EntrepreneurPulse.com.au  Fact Sheet for Healthcare Providers: IncredibleEmployment.be  This test is not yet approved or cleared by the [place] FDA and has been authorized for detection and/or diagnosis of SARS-CoV-2 by FDA under an Emergency Use Authorization (EUA). This EUA will remain in effect (meaning this test can be used) for the duration of the COVID-19 declaration under Section  564(b)(1) of the Act, 21 U.S.C. section 360bbb-3(b)(1), unless the authorization is terminated or revoked.  Performed at [facility], [address] [place], [place] [address]   Blood Culture (routine x 2)     Status: None (Preliminary result)   Collection Time: 08/01/22  1:00 PM   Specimen: BLOOD LEFT ARM  Result Value Ref Range Status   Specimen Description   Final    BLOOD LEFT ARM Performed at [facility], [place] [address], [place], [place] [address]    Special Requests   Final    Blood Culture adequate volume BOTTLES DRAWN AEROBIC AND ANAEROBIC Performed at [facility], [address] [place], [place] [address]    Culture   Final    NO GROWTH 2 DAYS Performed at [facility], [place] [address] [place], [place] [address]    Report Status PENDING  Incomplete  Blood Culture (routine x 2)     Status: None (Preliminary result)   Collection Time: 08/01/22  1:20 PM   Specimen: BLOOD RIGHT HAND  Result Value Ref Range Status   Specimen Description   Final    BLOOD RIGHT HAND Performed at [facility], [place] [address] [place], [place] [address]    Special Requests   Final    Blood Culture adequate  volume BOTTLES DRAWN AEROBIC AND ANAEROBIC Performed at [facility], [address] [place], [place] [address]    Culture   Final    NO GROWTH 2 DAYS Performed at [facility], [place] [address], [place], [place] [address]    Report Status PENDING  Incomplete  Urine Culture (for pregnant, neutropenic or urologic patients or patients with an indwelling urinary catheter)     Status: Abnormal   Collection Time: 08/01/22  1:50 PM   Specimen: Urine, Clean Catch  Result Value Ref Range Status   Specimen Description   Final    URINE, CLEAN CATCH Performed at [facility], [address] [place], [place] [address]    Special Requests   Final    NONE Performed at [facility], [address] [place], [place] [address]    Culture (A)  Final    <10,000 COLONIES/mL INSIGNIFICANT GROWTH Performed at [facility], [place] [address] [place], [place] [address]    Report Status 08/02/2022 FINAL  Final     Time coordinating discharge: 35 minutes  SIGNED:   Barb Merino, MD  [facility] 08/03/2022, 10:33 AM

## 2022-08-03 NOTE — TOC Transition Note (Signed)
Transition of Care [facility]) - CM/SW Discharge Note   Patient Details  Name: Aaron Pope MRN: [id number] Date of Birth: 1946/03/11  [facility] CM/SW Contact:  Illene Regulus, LCSW Phone Number: 08/03/2022, 10:43 AM   Clinical Narrative:    CSW informed [facility] agency of pt's discharge. Pt is being followed by [facility]. New orders are placed. No additional needs . TOC sign off.          Patient Goals and CMS Choice      Discharge Placement                         Discharge Plan and Services Additional resources added to the After Visit Summary for                                       Social Determinants of Health (SDOH) Interventions SDOH Screenings   Food Insecurity: No Food Insecurity (08/01/2022)  Housing: Low Risk  (08/01/2022)  Transportation Needs: No Transportation Needs (08/01/2022)  Utilities: Not At Risk (08/01/2022)  Tobacco Use: Low Risk  (08/01/2022)     Readmission Risk Interventions     No data to display

## 2022-08-06 LAB — CULTURE, BLOOD (ROUTINE X 2)
Culture: NO GROWTH
Culture: NO GROWTH
Special Requests: ADEQUATE
Special Requests: ADEQUATE
